# Patient Record
Sex: Female | Born: 1995 | Race: Black or African American | Hispanic: No | Marital: Single | Smoking: Never smoker
Health system: Southern US, Community
[De-identification: ages and names within clinical notes are randomized; demographics above are authoritative.]

## PROBLEM LIST (undated history)

## (undated) DIAGNOSIS — F329 Major depressive disorder, single episode, unspecified: Secondary | ICD-10-CM

## (undated) DIAGNOSIS — T7840XA Allergy, unspecified, initial encounter: Secondary | ICD-10-CM

## (undated) DIAGNOSIS — F419 Anxiety disorder, unspecified: Secondary | ICD-10-CM

## (undated) DIAGNOSIS — H539 Unspecified visual disturbance: Secondary | ICD-10-CM

## (undated) DIAGNOSIS — F32A Depression, unspecified: Secondary | ICD-10-CM

## (undated) DIAGNOSIS — Z789 Other specified health status: Secondary | ICD-10-CM

## (undated) DIAGNOSIS — F4310 Post-traumatic stress disorder, unspecified: Secondary | ICD-10-CM

## (undated) HISTORY — PX: NO PAST SURGERIES: SHX2092

## (undated) HISTORY — DX: Other specified health status: Z78.9

## (undated) HISTORY — DX: Unspecified visual disturbance: H53.9

## (undated) HISTORY — DX: Allergy, unspecified, initial encounter: T78.40XA

---
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
## 2017-04-07 ENCOUNTER — Ambulatory Visit: Payer: Medicaid Other | Admitting: Pediatrics

## 2017-04-29 ENCOUNTER — Ambulatory Visit: Payer: Medicaid Other | Admitting: Pediatrics

## 2017-04-29 ENCOUNTER — Ambulatory Visit (INDEPENDENT_AMBULATORY_CARE_PROVIDER_SITE_OTHER): Payer: Medicaid Other | Admitting: Pediatrics

## 2017-04-29 ENCOUNTER — Encounter: Payer: Self-pay | Admitting: Pediatrics

## 2017-04-29 VITALS — BP 120/70 | HR 83 | Ht 64.0 in | Wt 178.8 lb

## 2017-04-29 DIAGNOSIS — N946 Dysmenorrhea, unspecified: Secondary | ICD-10-CM

## 2017-04-29 DIAGNOSIS — Z00121 Encounter for routine child health examination with abnormal findings: Secondary | ICD-10-CM

## 2017-04-29 DIAGNOSIS — Z91010 Allergy to peanuts: Secondary | ICD-10-CM

## 2017-04-29 DIAGNOSIS — G4700 Insomnia, unspecified: Secondary | ICD-10-CM

## 2017-04-29 DIAGNOSIS — Z0001 Encounter for general adult medical examination with abnormal findings: Secondary | ICD-10-CM | POA: Diagnosis not present

## 2017-04-29 DIAGNOSIS — Z68.41 Body mass index (BMI) pediatric, 85th percentile to less than 95th percentile for age: Secondary | ICD-10-CM

## 2017-04-29 DIAGNOSIS — N920 Excessive and frequent menstruation with regular cycle: Secondary | ICD-10-CM

## 2017-04-29 DIAGNOSIS — Z113 Encounter for screening for infections with a predominantly sexual mode of transmission: Secondary | ICD-10-CM

## 2017-04-29 LAB — POCT RAPID HIV: Rapid HIV, POC: NEGATIVE

## 2017-04-29 MED ORDER — HYDROXYZINE PAMOATE 25 MG PO CAPS: 25.0000 mg | ORAL_CAPSULE | Freq: Every evening | ORAL | 1 refills | Status: DC | PRN

## 2017-04-29 MED ORDER — EPINEPHRINE 0.3 MG/0.3ML IJ SOAJ: 0.3000 mg | Freq: Once | INTRAMUSCULAR | 1 refills | Status: AC

## 2017-04-29 MED ORDER — IBUPROFEN 600 MG PO TABS: ORAL_TABLET | ORAL | 6 refills | Status: DC

## 2017-04-29 MED ORDER — NORETHINDRONE-ETH ESTRADIOL 1-35 MG-MCG PO TABS: 1.0000 | ORAL_TABLET | Freq: Every day | ORAL | 11 refills | Status: DC

## 2017-04-29 NOTE — Patient Instructions (Signed)
Use mild unscented soap for bathing.  Avoid douches or feminine products after period ends.  Take a probiotic or have yogurt every day

## 2017-04-29 NOTE — Progress Notes (Signed)
Adolescent Well Care Visit Tonya CoolerSierra Martin is a 21 y.o. female who is here for well care.  She is accompanied by her "sister" (daughter of her foster parents)    PCP:  Gregor Hamsebben, Izamar Linden, NP   History was provided by the patient.  Confidentiality was discussed with the patient and, if applicable, with caregiver as well. Patient's personal or confidential phone number: [phone]   Current Issues: Current concerns include  Treated for BV and Chlamydia 04/07/17.  Seems to get BV every time she has a period.  Needs refills of her Epi-pen (peanut allergic), Ibuprofen (for dysmenorrhea), Hydroxyzine (for anxiety and sleeplessness)  Nutrition: Nutrition/Eating Behaviors: various foods, mostly fast food option on campus Adequate calcium in diet?: loves milk Supplements/ Vitamins: no  Exercise/ Media: Play any Sports?/ Exercise: tries to work out several times a week  Screen Time:  > 2 hours-counseling provided Media Rules or Monitoring?: no  Sleep:  Sleep: 8 hours  Social Screening: Lives with:  Former foster family.  Will be in dorm when school starts Parental relations:  good Activities, Work, and Regulatory affairs officerChores?: working at [employer] this summer, will continue when school starts Concerns regarding behavior with peers?  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 Menstrual History: monthly, on now   Confidential Social History: Tobacco?  no Secondhand smoke exposure?  no Drugs/ETOH?  Yes, smokes weed occ  Sexually Active?  Yes, hasn't had sex since treated for chlamydia 3 weeks ago.  Says she informed her partner of his need to be tested but hasn't heard back from him Pregnancy Prevention: was on OCP's but recently only using condoms.  Not interested in implant or IUD  Safe at  home, in school & in relationships?  Yes Safe to self?  Yes   Screenings: Patient has a dental home: yes  The patient completed the Rapid Assessment for Adolescent Preventive Services screening questionnaire and the following topics were identified as risk factors and discussed: healthy eating, marijuana use, drug use, condom use and birth control  In addition, the following topics were discussed as part of anticipatory guidance exercise, mental health issues and screen time.  PHQ-9 completed and results indicated score of 6.  Has hx of insomnia and not getting enough sleep effects her mood  Physical Exam:  Vitals:   04/29/17 1406  BP: 120/70  Pulse: 83  Weight: 178 lb 12.8 oz (81.1 kg)  Height: 5\' 4"  (1.626 m)   BP 120/70 (BP Location: Right Arm, Patient Position: Sitting, Cuff Size: Normal)   Pulse 83   Ht 5\' 4"  (1.626 m)   Wt 178 lb 12.8 oz (81.1 kg)   BMI 30.69 kg/m  Body mass index: body mass index is 30.69 kg/m. Blood pressure percentiles are 79 % systolic and 70 % diastolic based on the August 2017 AAP Clinical Practice Guideline. Blood pressure percentile targets: 90: 126/78, 95: 129/81, 95 + 12 mmHg: 141/93. This reading is in the elevated blood pressure range (BP >= 120/80).   Hearing Screening   Method: Audiometry   125Hz  250Hz  500Hz  1000Hz  2000Hz  3000Hz  4000Hz  6000Hz  8000Hz   Right ear:   20 20 20  20     Left ear:   20 20 20  20       Visual Acuity Screening   Right eye Left eye Both  eyes  Without correction:     With correction: 20/20 20/20     General Appearance:   alert, oriented, no acute distress, obese teen  HENT: Normocephalic, no obvious abnormality, conjunctiva clear  Mouth:   Normal appearing teeth, no obvious discoloration, dental caries, or dental caps  Neck:   Supple; thyroid: no enlargement, symmetric, no tenderness/mass/nodules  Chest symm  Lungs:   Clear to auscultation bilaterally, normal work of breathing  Heart:   Regular rate and rhythm, S1  and S2 normal, no murmurs;   Abdomen:   Soft, non-tender, no mass, or organomegaly  GU genitalia not examined- on menses  Musculoskeletal:   Tone and strength strong and symmetrical, all extremities               Lymphatic:   No cervical adenopathy  Skin/Hair/Nails:   Skin warm, dry and intact, no rashes, no bruises or petechiae  Neurologic:   Strength, gait, and coordination normal and age-appropriate     Assessment and Plan:   Adolescent Wellness Exam Obesity  Dysmenorrhea  Insomnia Peanut allergy  BMI is not appropriate for age  Hearing screening result:normal Vision screening result: normal  Orders Placed This Encounter  Procedures  . GC/Chlamydia Probe Amp  . POCT Rapid HIV    Rx per orders for Epi-pen, Hydroxyzine, Ibuprofen and OCP's  Discussed use of mild soap, avoiding feminine products like douches and [place], take a daily Probiotic  Return in 1 year for next [facility], or sooner if needed   Gregor Hams, PPCNP-BC

## 2017-04-30 ENCOUNTER — Ambulatory Visit: Payer: Medicaid Other | Admitting: Pediatrics

## 2017-04-30 LAB — GC/CHLAMYDIA PROBE AMP
CT PROBE, AMP APTIMA: NOT DETECTED
GC Probe RNA: NOT DETECTED

## 2017-08-11 ENCOUNTER — Ambulatory Visit (INDEPENDENT_AMBULATORY_CARE_PROVIDER_SITE_OTHER): Payer: Medicaid Other | Admitting: Licensed Clinical Social Worker

## 2017-08-11 ENCOUNTER — Ambulatory Visit (INDEPENDENT_AMBULATORY_CARE_PROVIDER_SITE_OTHER): Payer: Medicaid Other | Admitting: Pediatrics

## 2017-08-11 ENCOUNTER — Encounter: Payer: Self-pay | Admitting: Pediatrics

## 2017-08-11 VITALS — Temp 98.0°F | Ht 64.75 in | Wt 178.6 lb

## 2017-08-11 DIAGNOSIS — Z7251 High risk heterosexual behavior: Secondary | ICD-10-CM

## 2017-08-11 DIAGNOSIS — F4323 Adjustment disorder with mixed anxiety and depressed mood: Secondary | ICD-10-CM

## 2017-08-11 DIAGNOSIS — B081 Molluscum contagiosum: Secondary | ICD-10-CM | POA: Diagnosis not present

## 2017-08-11 DIAGNOSIS — N898 Other specified noninflammatory disorders of vagina: Secondary | ICD-10-CM | POA: Diagnosis not present

## 2017-08-11 DIAGNOSIS — Z3200 Encounter for pregnancy test, result unknown: Secondary | ICD-10-CM

## 2017-08-11 DIAGNOSIS — Z8659 Personal history of other mental and behavioral disorders: Secondary | ICD-10-CM | POA: Insufficient documentation

## 2017-08-11 LAB — POCT URINE PREGNANCY: Preg Test, Ur: NEGATIVE

## 2017-08-11 MED ORDER — METRONIDAZOLE 500 MG PO TABS: 500.0000 mg | ORAL_TABLET | Freq: Two times a day (BID) | ORAL | 0 refills | Status: DC

## 2017-08-11 MED ORDER — NORETHINDRONE-ETH ESTRADIOL 1-35 MG-MCG PO TABS: 1.0000 | ORAL_TABLET | Freq: Every day | ORAL | 11 refills | Status: DC

## 2017-08-11 NOTE — Progress Notes (Signed)
Subjective:     Patient ID: Tonya Martin, female   DOB: 04/10/1996, 21 y.o.   MRN: [id number]  HPI:  21 year old female in by herself.  She has hx of depression and was on meds in the past.  Interested in resuming medication.  Sees a therapist (last name Sloan) at [facility].  She is currently a [employer] and also works at [employer].    She is in a new relationship since her visit in July.  She is only using condoms but does not want to get pregnant.  Doesn't feel like she can handle getting the Nexplanon right now but agreed to go back on her OCP's.  She currently has a funky-smelling, non-itchy vaginal discharge that she thinks is BV.  She has had this before.  Recently noticed tiny red bumps on her inner upper thighs near groin.  Areas are not red, itchy or draining pus. Not painful.   Review of Systems:non-contributory except as mentioned in HPI     Objective:   Physical Exam  Constitutional: She appears well-developed and well-nourished.  Genitourinary:  Genitourinary Comments: Exam deferred today  Skin:  Tiny, flesh-colored, wart-like papules on inner aspect of upper thighs just below groin area. Not inflamed or pustular  Nursing note and vitals reviewed.      Assessment:     Hx of depression Vaginal discharge- R/O STI High risk sexual behavior molloscum contagiosum     Plan:     Wet Prep- patient obtained Urine pregnancy test Urine for GC/Chlamydia  Rx per orders for OCP's and Metronidazole  [facility], Tim LairHannah Moore, spoke with patient.   Referral to [facility] for med management for antidepressants   Gregor HamsJacqueline Oakley Orban, PPCNP-BC

## 2017-08-11 NOTE — BH Specialist Note (Signed)
Integrated Behavioral Health Initial Visit  MRN: [id number] Name: Tonya Martin  Number of Integrated Behavioral Health Clinician visits:: 1/6 Session Start time: 10:30  Session End time: 11:25 Total time: 55 minutes  Type of Service: Integrated Behavioral Health- Individual/Family Interpretor:No. Interpretor Name and Language: n/a   Warm Hand Off Completed.       SUBJECTIVE: Tonya CoolerSierra Viramontes is a 21 y.o. female accompanied by self Patient was referred by J. Tebben, NP for pt desire to restart depression medications. Patient reports the following symptoms/concerns: Being interested in medication for depression, pt reports being connected to counseling through [facility], pt reports that she had been taking meds in the past, stopped taking it a few years ago, stopped taking it because she felt better, symptoms have returned, pt reports being tearful every day for a month, reports academic concerns, sleeping a lot, not eating a lot Duration of problem: several months; Severity of problem: severe  OBJECTIVE: Mood: Anxious, Depressed and Irritable and Affect: Appropriate and Depressed Risk of harm to self or others: No plan to harm self or others  LIFE CONTEXT: Family and Social: Pt reports foster family recently kicked her out of the house, pt is living on campus at school. Pt reports a few close and supportive friends at school, otherwise reports being isolated and not spending time with people a lot. 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 Pt reports often skipping classes, citing anxiety and worries about others judging her as part of her skipping classes. Pt also reports often not being able to get out of bed to get to class. Pt reports that work is sometimes stressful as well.  Self-Care: Listens to music, reports sleeping is helpful, reports sleeping for hours at a time. Pt reports being connected to a counselor through [facility]  counseling. Life Changes: Former foster family kicked her out in the last few months  GOALS ADDRESSED: Patient will: 1. Reduce symptoms of: anxiety and depression 2. Increase knowledge and/or ability of: coping skills and self-management skills  3. Demonstrate ability to: Increase healthy adjustment to current life circumstances and Increase adequate support systems for patient/family  INTERVENTIONS: Interventions utilized: Solution-Focused Strategies, Supportive Counseling and Link to [facility]  Standardized Assessments completed: PHQ-SADS  PHQ-SADS SCORE ONLY 08/11/2017  PHQ-15 11  GAD-7 18  PHQ-9 20  Suicidal Ideation No  Comment ADLs very difficult    ASSESSMENT: Patient currently experiencing elevated symptoms of anxiety and depression, as indicated both by pts verbal report as well as results from screening tool. Pt is connected to counseling in the community, reports that being a supportive and helpful relationship. Pt is also seeking out depression medication in addition to supportive counseling.   Patient may benefit from continuing to see her community counselor, appt scheduled for next week. Pt may also benefit form scheduling an appt with adolescent health in this clinic for medication management. Pt may also benefit from using mental health apps when at school and feeling anxious or depressed.  PLAN: 1. Follow up with behavioral health clinician on : None scheduled, pt connected w/ counseling, BH available as needed through PCP or adolescent pod. 2. Behavioral recommendations: Pt will continue to go to sessions with her counselor, pt will return to the clinic for appt with adolescent health, pt will download and utilize mental health apps 3. Referral(s): [facility] and adolescent medicine in this clinic. Appt scheduled for 08/20/17. Pt is already connected  to counseling 4. "From scale of 1-10, how likely are you to follow  plan?": pt expressed understanding and agreement.  Noralyn Pick, LPCA

## 2017-08-11 NOTE — Patient Instructions (Signed)

## 2017-08-12 LAB — WET PREP BY MOLECULAR PROBE
Candida species: NOT DETECTED
MICRO NUMBER: 81239665
SPECIMEN QUALITY:: ADEQUATE
TRICHOMONAS VAG: NOT DETECTED

## 2017-08-12 LAB — C. TRACHOMATIS/N. GONORRHOEAE RNA
C. TRACHOMATIS RNA, TMA: DETECTED — AB
N. GONORRHOEAE RNA, TMA: NOT DETECTED

## 2017-08-20 ENCOUNTER — Ambulatory Visit: Payer: Medicaid Other | Admitting: Pediatrics

## 2017-09-10 ENCOUNTER — Telehealth: Payer: Self-pay | Admitting: Pediatrics

## 2017-09-10 NOTE — Telephone Encounter (Signed)
Phone call to patient's cell- no answer.  Left message on home number to have her call me for review of lab results.  She was seen last month and treated for BV.  Her Chlamydia test came back positive and she needs treatment.   Gregor HamsJacqueline Gradie Martin, PPCNP-BC

## 2017-09-11 ENCOUNTER — Telehealth: Payer: Self-pay

## 2017-09-11 NOTE — Telephone Encounter (Signed)
-----   Message from Gregor HamsJacqueline Tebben, NP sent at 09/11/2017  9:00 AM EST ----- I saw Tonya Martin last month and treated her for BV.  Her Chlamydia came back positive and she needs treatment.  There was no answer when I called her cell yesterday and I left message on home phone for her to call us back about lab results.  Can you try her cell again today?  I'd like to have her treated before the weekend.  If she can't get in, I'm happy to call in Rx  Gregor HamsJacqueline Tebben, PPCNP-BC

## 2017-09-11 NOTE — Telephone Encounter (Signed)
Called cell phone and got message it was out of service. Left message on home phone to call [facility].

## 2017-09-12 NOTE — Telephone Encounter (Signed)
Brandy Semmons [facility] called to ask if this patient had been treated and that reportable disease form be [facility] We have been unable to contact patient by phone; letter generated in epic and mailed to home address on file. Reportable diseases form completed and [facility] [phone], confirmation received.

## 2017-09-17 ENCOUNTER — Telehealth: Payer: Self-pay

## 2017-09-17 ENCOUNTER — Other Ambulatory Visit: Payer: Self-pay | Admitting: Pediatrics

## 2017-09-17 DIAGNOSIS — A749 Chlamydial infection, unspecified: Secondary | ICD-10-CM

## 2017-09-17 MED ORDER — AZITHROMYCIN 500 MG PO TABS: ORAL_TABLET | ORAL | 0 refills | Status: DC

## 2017-09-17 NOTE — Telephone Encounter (Signed)
Spoke with patient and relayed message about positive Chlamydia. Encouraged office visit. However,  pt reports that she is unable to come to clinic anytime due to [place]. She requests treatment to be [facility] [place] on [address] Christella Hartiganouting to Dora SimsJackie Tebben, NP to review and advise.

## 2017-09-17 NOTE — Telephone Encounter (Signed)
Patient needs to come in for an appointment for treatment of chlamydia. Attempted to return patient call but went to VM. Left message to call [facility]

## 2017-09-17 NOTE — Progress Notes (Signed)
Teen with positive Chlamydia who has been hard to contact.  She finally returned our call and is about to move to [place].  She requests we send a Rx to her pharmacy for treatment.  Rx per orders for Azithromycin  Will send [facility] to [facility].   Gregor HamsJacqueline Royal Vandevoort, PPCNP-BC

## 2017-09-18 NOTE — Telephone Encounter (Signed)
Called patient and made her aware that treatment was sent to the pharmacy. Pt voiced understanding and plans to tell partner as well to ensure he is treated. No further action needed.

## 2018-01-04 ENCOUNTER — Ambulatory Visit (HOSPITAL_COMMUNITY)
Admission: EM | Admit: 2018-01-04 | Discharge: 2018-01-04 | Disposition: A | Discharge status: Home / Self Care | Payer: Medicaid Other | Attending: Internal Medicine | Admitting: Internal Medicine

## 2018-01-04 ENCOUNTER — Encounter (HOSPITAL_COMMUNITY): Payer: Self-pay | Admitting: Family Medicine

## 2018-01-04 DIAGNOSIS — R110 Nausea: Secondary | ICD-10-CM

## 2018-01-04 DIAGNOSIS — F1290 Cannabis use, unspecified, uncomplicated: Secondary | ICD-10-CM | POA: Insufficient documentation

## 2018-01-04 DIAGNOSIS — M791 Myalgia, unspecified site: Secondary | ICD-10-CM

## 2018-01-04 DIAGNOSIS — Z3202 Encounter for pregnancy test, result negative: Secondary | ICD-10-CM | POA: Diagnosis not present

## 2018-01-04 DIAGNOSIS — B349 Viral infection, unspecified: Secondary | ICD-10-CM

## 2018-01-04 DIAGNOSIS — F329 Major depressive disorder, single episode, unspecified: Secondary | ICD-10-CM | POA: Diagnosis not present

## 2018-01-04 DIAGNOSIS — R51 Headache: Secondary | ICD-10-CM

## 2018-01-04 DIAGNOSIS — F17200 Nicotine dependence, unspecified, uncomplicated: Secondary | ICD-10-CM | POA: Insufficient documentation

## 2018-01-04 DIAGNOSIS — R05 Cough: Secondary | ICD-10-CM | POA: Diagnosis not present

## 2018-01-04 DIAGNOSIS — F4323 Adjustment disorder with mixed anxiety and depressed mood: Secondary | ICD-10-CM | POA: Insufficient documentation

## 2018-01-04 LAB — POCT PREGNANCY, URINE: PREG TEST UR: NEGATIVE

## 2018-01-04 LAB — POCT URINALYSIS DIP (DEVICE)
Glucose, UA: NEGATIVE mg/dL
Hgb urine dipstick: NEGATIVE
Ketones, ur: 80 mg/dL — AB
Leukocytes, UA: NEGATIVE
NITRITE: NEGATIVE
PH: 6 (ref 5.0–8.0)
PROTEIN: NEGATIVE mg/dL
Specific Gravity, Urine: >=1.03 (ref 1.005–1.030)
UROBILINOGEN UA: 0.2 mg/dL (ref 0.0–1.0)

## 2018-01-04 MED ORDER — ONDANSETRON 4 MG PO TBDP: 4.0000 mg | ORAL_TABLET | Freq: Three times a day (TID) | ORAL | 0 refills | Status: DC | PRN

## 2018-01-04 MED ORDER — IPRATROPIUM BROMIDE 0.06 % NA SOLN: 2.0000 | Freq: Four times a day (QID) | NASAL | 0 refills | Status: DC

## 2018-01-04 MED ORDER — FLUTICASONE PROPIONATE 50 MCG/ACT NA SUSP: 2.0000 | Freq: Every day | NASAL | 0 refills | Status: DC

## 2018-01-04 MED ORDER — BENZONATATE 100 MG PO CAPS: 100.0000 mg | ORAL_CAPSULE | Freq: Three times a day (TID) | ORAL | 0 refills | Status: DC

## 2018-01-04 MED ORDER — MELOXICAM 7.5 MG PO TABS: 7.5000 mg | ORAL_TABLET | Freq: Every day | ORAL | 0 refills | Status: DC

## 2018-01-04 NOTE — Discharge Instructions (Signed)
Urine negative for urinary tract infection, pregnancy. Tessalon for cough.  Zofran as needed for nausea/vomiting.  Mobic for body aches, do not take ibuprofen or naproxen while on Mobic.  Start flonase, atrovent nasal spray for nasal congestion/drainage. You can use over the counter nasal saline rinse such as neti pot for nasal congestion. Keep hydrated, your urine should be clear to pale yellow in color.  Bland diet, advance as tolerated. Tylenol for fever and pain. Monitor for any worsening of symptoms, chest pain, shortness of breath, wheezing, swelling of the throat, follow up for reevaluation.   STD testing sent, you will be contacted with any positive results that requires further treatment. Refrain from sexual activity and alcohol use for the next 7 days. Monitor for any worsening of symptoms, fever, abdominal pain, nausea, vomiting, to follow up for reevaluation.

## 2018-01-04 NOTE — ED Triage Notes (Signed)
Pt here for cough and body aches since yesterday. sts also headache, diarrhea and frequent urination.

## 2018-01-04 NOTE — ED Notes (Signed)
Patient still unable to void.

## 2018-01-04 NOTE — ED Provider Notes (Signed)
[facility]    CSN: [id number] Arrival date & time: 01/04/18  1548     History   Chief Complaint Chief Complaint  Patient presents with  . Generalized Body Aches  . Cough    HPI Tonya Martin is a 22 y.o. female.   22 year old female comes in for 2-day history of URI symptoms.  She has had cough, body ache, nasal congestion, rhinorrhea, headache.  She has also had right-sided abdominal cramping that has since resolved.  Had nausea last night without vomiting, denies current nausea.  She has had 3 episodes of loose  stool without watery diarrhea.  Denies blood in stool.  Has also experienced urinary frequency, urgency without hematuria.  Denies fever, chills, night sweats. otc emergent-c without improvement. Current some day smoker.      Past Medical History:  Diagnosis Date  . Allergy   . Vision abnormalities     Patient Active Problem List   Diagnosis Date Noted  . History of depression 08/11/2017  . Vaginal discharge 08/11/2017  . High risk heterosexual behavior 08/11/2017  . Other seasonal allergic rhinitis 12/27/2015  . Adjustment disorder with mixed anxiety and depressed mood 04/13/2013  . Dysmenorrhea 04/13/2013  . Food allergy 04/13/2013    History reviewed. No pertinent surgical history.  OB History   None      Home Medications    Prior to Admission medications   Medication Sig Start Date End Date Taking? Authorizing Provider  benzonatate (TESSALON) 100 MG capsule Take 1 capsule (100 mg total) by mouth every 8 (eight) hours. 01/04/18   Cathie HoopsYu, Florita Nitsch V, PA-C  fluticasone (FLONASE) 50 MCG/ACT nasal spray Place 2 sprays into both nostrils daily. 01/04/18   Belinda FisherYu, Olean Sangster V, PA-C  ibuprofen (ADVIL,MOTRIN) 600 MG tablet Take one tablet at first sign of cramps then every 6 hours for pain 04/29/17   Gregor Hamsebben, Jacqueline, NP  ipratropium (ATROVENT) 0.06 % nasal spray Place 2 sprays into both nostrils 4 (four) times daily. 01/04/18   Cathie HoopsYu, Aishwarya Shiplett V, PA-C  meloxicam (MOBIC)  7.5 MG tablet Take 1 tablet (7.5 mg total) by mouth daily. 01/04/18   Cathie HoopsYu, Arien Morine V, PA-C  ondansetron (ZOFRAN ODT) 4 MG disintegrating tablet Take 1 tablet (4 mg total) by mouth every 8 (eight) hours as needed for nausea or vomiting. 01/04/18   Belinda FisherYu, Cera Rorke V, PA-C    Family History Family History  Problem Relation Age of Onset  . Cancer Mother   . Diabetes Mother   . Hyperlipidemia Mother   . Hypertension Mother   . Diabetes Maternal Grandmother   . Hypertension Maternal Grandmother     Social History Social History   Tobacco Use  . Smoking status: Current Some Day Smoker  . Smokeless tobacco: Never Used  . Tobacco comment: marijuana   Substance Use Topics  . Alcohol use: Not on file  . Drug use: Not on file     Allergies   Peanut-containing drug products   Review of Systems Review of Systems  Reason unable to perform ROS: See HPI as above.     Physical Exam Triage Vital Signs ED Triage Vitals [01/04/18 1653]  Enc Vitals Group     BP 139/86     Pulse Rate (!) 105     Resp 18     Temp 99.5 F (37.5 C)     Temp Source Oral     SpO2 100 %     Weight      Height  Head Circumference      Peak Flow      Pain Score 7     Pain Loc      Pain Edu?      Excl. in GC?    No data found.  Updated Vital Signs BP 139/86   Pulse (!) 105   Temp 99.5 F (37.5 C) (Oral)   Resp 18   LMP 12/04/2017   SpO2 100%   Physical Exam  Constitutional: She is oriented to person, place, and time. She appears well-developed and well-nourished. No distress.  HENT:  Head: Normocephalic and atraumatic.  Right Ear: Tympanic membrane, external ear and ear canal normal. Tympanic membrane is not erythematous and not bulging.  Left Ear: Tympanic membrane, external ear and ear canal normal. Tympanic membrane is not erythematous and not bulging.  Nose: Mucosal edema and rhinorrhea present. Right sinus exhibits no maxillary sinus tenderness and no frontal sinus tenderness. Left sinus exhibits  no maxillary sinus tenderness and no frontal sinus tenderness.  Mouth/Throat: Uvula is midline, oropharynx is clear and moist and mucous membranes are normal.  Eyes: Pupils are equal, round, and reactive to light. Conjunctivae are normal.  Neck: Normal range of motion. Neck supple.  Cardiovascular: Normal rate, regular rhythm and normal heart sounds. Exam reveals no gallop and no friction rub.  No murmur heard. Pulmonary/Chest: Effort normal and breath sounds normal. She has no decreased breath sounds. She has no wheezes. She has no rhonchi. She has no rales.  Abdominal: Soft. Bowel sounds are normal.  Generalized tenderness to palpation without guarding or rebound.  No rigidity.  No CVA tenderness.  Lymphadenopathy:    She has no cervical adenopathy.  Neurological: She is alert and oriented to person, place, and time.  Skin: Skin is warm and dry.  Psychiatric: She has a normal mood and affect. Her behavior is normal. Judgment normal.     UC Treatments / Results  Labs (all labs ordered are listed, but only abnormal results are displayed) Labs Reviewed  POCT URINALYSIS DIP (DEVICE) - Abnormal; Notable for the following components:      Result Value   Bilirubin Urine SMALL (*)    Ketones, ur 80 (*)    All other components within normal limits  POCT PREGNANCY, URINE  URINE CYTOLOGY ANCILLARY ONLY    EKG None Radiology No results found.  Procedures Procedures (including critical care time)  Medications Ordered in UC Medications - No data to display   Initial Impression / Assessment and Plan / UC Course  I have reviewed the triage vital signs and the nursing notes.  Pertinent labs & imaging results that were available during my care of the patient were reviewed by me and considered in my medical decision making (see chart for details).    Urine negative for UTI/pregnancy. Will treat for viral illness. Symptomatic treatment discussed. Patient would also like STD testing,  cytology sent. Push fluids. Return precautions given. Patient expresses understanding and agrees to plan.   Final Clinical Impressions(s) / UC Diagnoses   Final diagnoses:  Viral illness    ED Discharge Orders        Ordered    meloxicam (MOBIC) 7.5 MG tablet  Daily     01/04/18 1817    benzonatate (TESSALON) 100 MG capsule  Every 8 hours     01/04/18 1817    fluticasone (FLONASE) 50 MCG/ACT nasal spray  Daily     01/04/18 1817    ipratropium (ATROVENT) 0.06 % nasal  spray  4 times daily     01/04/18 1817    ondansetron (ZOFRAN ODT) 4 MG disintegrating tablet  Every 8 hours PRN     01/04/18 1817        Belinda Fisher, PA-C 01/04/18 1820

## 2018-01-05 LAB — URINE CYTOLOGY ANCILLARY ONLY
Chlamydia: NEGATIVE
NEISSERIA GONORRHEA: NEGATIVE
TRICH (WINDOWPATH): NEGATIVE

## 2018-01-07 LAB — URINE CYTOLOGY ANCILLARY ONLY: Candida vaginitis: NEGATIVE

## 2018-01-08 ENCOUNTER — Telehealth (HOSPITAL_COMMUNITY): Payer: Self-pay

## 2018-01-08 NOTE — Telephone Encounter (Signed)
Pt called regarding test results from recent visit. Report no vaginal discharge or irritation at this time.

## 2018-10-07 NOTE — L&D Delivery Note (Addendum)
Delivery Note  Tonya Martin is a 23 y.o. G1P0 s/p SVD at [redacted]w[redacted]d. She was admitted for SOL with pregnancy complicated by new onset gHTN.   ROM: 2h 37m with clear fluid GBS Status:  --Henderson Cloud (09/22 1156) Maximum Maternal Temperature: 98.7 F  Labor Progress: . Patient presented to L&D for  SOL  Initial SVE: 4 cm. She was briefly augmented with pitocin which was then stopped for decelerations, and subsequently she progressed spontaneously to fully dilated and had unintentional AROM with SVE.   Delivery Date/Time: 10/26/200 at 1336  Delivery: Called to room and patient was complete and pushing. Head delivered LOA. No nuchal cord present. Initially head did not delivery with gentle traction, but after lowering head of bed and repositioning maternal legs head delivered easily without further maneuvers. Shoulder and body delivered in usual fashion. Infant with spontaneous cry, placed on mother's abdomen, dried and stimulated. Cord clamped x 2 after 1-minute delay, and cut by father. Cord blood drawn. Placenta delivered spontaneously with gentle cord traction. Fundus firm with massage and Pitocin. Labia, perineum, vagina, and cervix inspected inspected with 1st degree laceration hemostatic with applied pressure and hemostatic L periuretheral. Neither laceration was repaired. Subsequently a post-placental Liletta IUD was placed with bimanual technique without complication, see separate procedure note.   Baby Weight: 3185 grams  Placenta: Sent to L&D Complications: None [id number]:4OE degree perineal and L periurtheral, both hemostatic and not repaired  EBL: 300 cc Analgesia: Epidural  Infant: APGAR (1 MIN): 8   APGAR (5 MINS): 9    Maxie Better, MD, PGY1  [facility] [place] [facility] 08/02/2019, 2:30 PM    OB FELLOW ATTESTATION  I was present, gloved, and supervising throughout the delivery. I have edited the above note to reflect any changes.   Augustin Coupe, MD/[facility] OB Fellow  08/02/2019, 2:38 PM

## 2018-10-09 ENCOUNTER — Encounter (HOSPITAL_COMMUNITY): Payer: Self-pay | Admitting: Emergency Medicine

## 2018-10-09 ENCOUNTER — Ambulatory Visit (HOSPITAL_COMMUNITY)
Admission: EM | Admit: 2018-10-09 | Discharge: 2018-10-09 | Disposition: A | Discharge status: Home / Self Care | Payer: Medicaid Other | Attending: Family Medicine | Admitting: Family Medicine

## 2018-10-09 DIAGNOSIS — F17200 Nicotine dependence, unspecified, uncomplicated: Secondary | ICD-10-CM | POA: Diagnosis not present

## 2018-10-09 DIAGNOSIS — Z791 Long term (current) use of non-steroidal anti-inflammatories (NSAID): Secondary | ICD-10-CM | POA: Insufficient documentation

## 2018-10-09 DIAGNOSIS — Z883 Allergy status to other anti-infective agents status: Secondary | ICD-10-CM | POA: Insufficient documentation

## 2018-10-09 DIAGNOSIS — Z792 Long term (current) use of antibiotics: Secondary | ICD-10-CM | POA: Diagnosis not present

## 2018-10-09 DIAGNOSIS — Z91018 Allergy to other foods: Secondary | ICD-10-CM | POA: Diagnosis not present

## 2018-10-09 DIAGNOSIS — Z833 Family history of diabetes mellitus: Secondary | ICD-10-CM | POA: Diagnosis not present

## 2018-10-09 DIAGNOSIS — N760 Acute vaginitis: Secondary | ICD-10-CM | POA: Diagnosis present

## 2018-10-09 DIAGNOSIS — Z91010 Allergy to peanuts: Secondary | ICD-10-CM | POA: Diagnosis not present

## 2018-10-09 DIAGNOSIS — Z79899 Other long term (current) drug therapy: Secondary | ICD-10-CM | POA: Diagnosis not present

## 2018-10-09 LAB — POCT URINALYSIS DIP (DEVICE)
GLUCOSE, UA: NEGATIVE mg/dL
Ketones, ur: 40 mg/dL — AB
NITRITE: NEGATIVE
PROTEIN: NEGATIVE mg/dL
UROBILINOGEN UA: 0.2 mg/dL (ref 0.0–1.0)
pH: 6 (ref 5.0–8.0)

## 2018-10-09 MED ORDER — METRONIDAZOLE 500 MG PO TABS: 500.0000 mg | ORAL_TABLET | Freq: Two times a day (BID) | ORAL | 0 refills | Status: DC

## 2018-10-09 MED ORDER — FLUCONAZOLE 150 MG PO TABS: 150.0000 mg | ORAL_TABLET | Freq: Every day | ORAL | 0 refills | Status: DC

## 2018-10-09 NOTE — ED Triage Notes (Signed)
Pt presents to [facility] for assessment of 1 month of vaginal discomfort, 1 week of "yeast" and one or two days of a swollen gland to her vagina.  States hx of yeast, BV and chlamydia.

## 2018-10-09 NOTE — Discharge Instructions (Addendum)
We will go ahead and treat you for a vaginal yeast and bacterial infection Medications sent to the pharmacy.  Swab sent for testing We will call with any positive results.  Urine was negative for pregnancy We will send the urine for culture.

## 2018-10-10 LAB — URINE CULTURE: CULTURE: NO GROWTH

## 2018-10-12 LAB — CERVICOVAGINAL ANCILLARY ONLY
CANDIDA VAGINITIS: POSITIVE — AB
CHLAMYDIA, DNA PROBE: NEGATIVE
NEISSERIA GONORRHEA: NEGATIVE
TRICH (WINDOWPATH): POSITIVE — AB

## 2018-10-13 ENCOUNTER — Telehealth (HOSPITAL_COMMUNITY): Payer: Self-pay | Admitting: Emergency Medicine

## 2018-10-13 NOTE — ED Provider Notes (Signed)
[facility]    CSN: [id number] Arrival date & time: 10/09/18  1427     History   Chief Complaint Chief Complaint  Patient presents with  . Vaginal Discharge    HPI Tonya Martin is a 23 y.o. female.   Patient is a 23 year old female that presents with 1 month of vaginal discomfort, discharge, irritation and swelling.  Symptoms have been constant and worsening.  She has not done anything for her symptoms.  She does have a past medical history of vaginal infections to include yeast, BV and chlamydia.  She is currently sexually active with one partner unprotected.Patient's last menstrual period was 09/16/2018.  She would like to be checked for STDs today.  She denies any abdominal pain, pelvic pain, back pain, dysuria, hematuria, urinary frequency.  She denies any fever, chills, body aches, night sweats, nausea, vomiting.   ROS per HPI       Past Medical History:  Diagnosis Date  . Allergy   . Vision abnormalities     Patient Active Problem List   Diagnosis Date Noted  . History of depression 08/11/2017  . Vaginal discharge 08/11/2017  . High risk heterosexual behavior 08/11/2017  . Other seasonal allergic rhinitis 12/27/2015  . Adjustment disorder with mixed anxiety and depressed mood 04/13/2013  . Dysmenorrhea 04/13/2013  . Food allergy 04/13/2013    History reviewed. No pertinent surgical history.  OB History   No obstetric history on file.      Home Medications    Prior to Admission medications   Medication Sig Start Date End Date Taking? Authorizing Provider  fluconazole (DIFLUCAN) 150 MG tablet Take 1 tablet (150 mg total) by mouth daily. 10/09/18   Janace ArisBast, [place]set Joshi A, NP  ibuprofen (ADVIL,MOTRIN) 600 MG tablet Take one tablet at first sign of cramps then every 6 hours for pain 04/29/17   Gregor Hamsebben, Jacqueline, NP  metroNIDAZOLE (FLAGYL) 500 MG tablet Take 1 tablet (500 mg total) by mouth 2 (two) times daily. 10/09/18   Janace ArisBast, Barney Russomanno A, NP    Family  History Family History  Problem Relation Age of Onset  . Cancer Mother   . Diabetes Mother   . Hyperlipidemia Mother   . Hypertension Mother   . Diabetes Maternal Grandmother   . Hypertension Maternal Grandmother     Social History Social History   Tobacco Use  . Smoking status: Current Some Day Smoker  . Smokeless tobacco: Never Used  . Tobacco comment: marijuana   Substance Use Topics  . Alcohol use: Not on file  . Drug use: Not on file     Allergies   Peanut-containing drug products and Flonase [fluticasone propionate]   Review of Systems Review of Systems   Physical Exam Triage Vital Signs ED Triage Vitals [10/09/18 1457]  Enc Vitals Group     BP 127/62     Pulse Rate 89     Resp 18     Temp 98.5 F (36.9 C)     Temp Source Oral     SpO2 100 %     Weight      Height      Head Circumference      Peak Flow      Pain Score 4     Pain Loc      Pain Edu?      Excl. in GC?    No data found.  Updated Vital Signs BP 127/62 (BP Location: Right Arm)   Pulse 89   Temp  98.5 F (36.9 C) (Oral)   Resp 18   LMP 09/16/2018   SpO2 100%   Visual Acuity Right Eye Distance:   Left Eye Distance:   Bilateral Distance:    Right Eye Near:   Left Eye Near:    Bilateral Near:     Physical Exam Vitals signs and nursing note reviewed.  Constitutional:      General: She is not in acute distress.    Appearance: Normal appearance. She is not ill-appearing, toxic-appearing or diaphoretic.  HENT:     Head: Normocephalic and atraumatic.     Nose: Nose normal.     Mouth/Throat:     Mouth: Mucous membranes are moist.     Pharynx: Oropharynx is clear.  Eyes:     Conjunctiva/sclera: Conjunctivae normal.  Neck:     Musculoskeletal: Normal range of motion.  Pulmonary:     Effort: Pulmonary effort is normal.  Abdominal:     Palpations: Abdomen is soft.     Tenderness: There is no abdominal tenderness. There is no right CVA tenderness, left CVA tenderness or  rebound.  Genitourinary:    Vagina: Vaginal discharge present.     Comments: Vulva  erythema and swelling.  Very tender to palpation of entire vaginal area.  Mild discharge noted without odor. No lesions or bleeding.  Internal exam deferred.  Musculoskeletal: Normal range of motion.  Skin:    General: Skin is warm and dry.  Neurological:     Mental Status: She is alert.  Psychiatric:        Mood and Affect: Mood normal.      UC Treatments / Results  Labs (all labs ordered are listed, but only abnormal results are displayed) Labs Reviewed  POCT URINALYSIS DIP (DEVICE) - Abnormal; Notable for the following components:      Result Value   Bilirubin Urine SMALL (*)    Ketones, ur 40 (*)    Hgb urine dipstick TRACE (*)    Leukocytes, UA SMALL (*)    All other components within normal limits  CERVICOVAGINAL ANCILLARY ONLY - Abnormal; Notable for the following components:   Candida vaginitis **POSITIVE for Candida species** (*)    Trichomonas **POSITIVE** (*)    All other components within normal limits  URINE CULTURE  POC URINE PREG, ED    EKG None  Radiology No results found.  Procedures Procedures (including critical care time)  Medications Ordered in UC Medications - No data to display  Initial Impression / Assessment and Plan / UC Course  I have reviewed the triage vital signs and the nursing notes.  Pertinent labs & imaging results that were available during my care of the patient were reviewed by me and considered in my medical decision making (see chart for details).     Patient is a 23 year old female that presents with vaginal discharge, itching, irritation and swelling for approximately 1 month.  We will go ahead and treat for bacterial vaginosis and yeast today pending swab results.  Instructed that we will call her with any positive results. Urine was negative for pregnancy or infection we will send urine for culture. Follow up as needed for continued or  worsening symptoms  Final Clinical Impressions(s) / UC Diagnoses   Final diagnoses:  Acute vaginitis     Discharge Instructions     We will go ahead and treat you for a vaginal yeast and bacterial infection Medications sent to the pharmacy.  Swab sent for testing We will call  with any positive results.  Urine was negative for pregnancy We will send the urine for culture.     ED Prescriptions    Medication Sig Dispense Auth. Provider   metroNIDAZOLE (FLAGYL) 500 MG tablet Take 1 tablet (500 mg total) by mouth 2 (two) times daily. 14 tablet Joshlynn Alfonzo A, NP   fluconazole (DIFLUCAN) 150 MG tablet Take 1 tablet (150 mg total) by mouth daily. 2 tablet Janace Aris, NP     Controlled Substance Prescriptions [place] Controlled Substance Registry consulted? no   Janace Aris, NP 10/13/18 [phone]

## 2018-10-13 NOTE — Telephone Encounter (Signed)
Trichomonas is positive. Rx metronidazole was given at the urgent care visit. Pt needs education to please refrain from sexual intercourse for 7 days to give the medicine time to work. Sexual partners need to be notified and tested/treated. Condoms may reduce risk of reinfection. Recheck for further evaluation if symptoms are not improving.   Candida (yeast) is positive.  Prescription for fluconazole was given at the urgent care visit.    Attempted to reach patient. No answer at this time. Call cannot be completed at this time.

## 2018-10-14 ENCOUNTER — Telehealth (HOSPITAL_COMMUNITY): Payer: Self-pay | Admitting: Emergency Medicine

## 2018-10-14 NOTE — Telephone Encounter (Signed)
Attempted to reach patient x2. No answer at this time. Call cannot be completed at this time.   

## 2018-10-18 ENCOUNTER — Telehealth (HOSPITAL_COMMUNITY): Payer: Self-pay | Admitting: Emergency Medicine

## 2018-10-18 NOTE — Telephone Encounter (Signed)
Attempted to reach patient x3. No answer at this time. Call cannot be completed. . Letter sent    

## 2018-11-12 ENCOUNTER — Telehealth (HOSPITAL_COMMUNITY): Payer: Self-pay | Admitting: Emergency Medicine

## 2018-11-12 NOTE — Telephone Encounter (Signed)
Returned patients voicemail, no answer, left voicemail.

## 2018-11-12 NOTE — Telephone Encounter (Signed)
Patient called back and given results, all questions answered.

## 2018-12-15 ENCOUNTER — Ambulatory Visit (INDEPENDENT_AMBULATORY_CARE_PROVIDER_SITE_OTHER): Payer: Medicaid Other | Admitting: Pediatrics

## 2018-12-15 ENCOUNTER — Encounter: Payer: Self-pay | Admitting: Pediatrics

## 2018-12-15 VITALS — BP 122/64 | HR 78 | Ht 65.75 in | Wt 168.2 lb

## 2018-12-15 DIAGNOSIS — Z8619 Personal history of other infectious and parasitic diseases: Secondary | ICD-10-CM | POA: Diagnosis not present

## 2018-12-15 DIAGNOSIS — Z3491 Encounter for supervision of normal pregnancy, unspecified, first trimester: Secondary | ICD-10-CM | POA: Diagnosis not present

## 2018-12-15 DIAGNOSIS — Z113 Encounter for screening for infections with a predominantly sexual mode of transmission: Secondary | ICD-10-CM

## 2018-12-15 DIAGNOSIS — Z3201 Encounter for pregnancy test, result positive: Secondary | ICD-10-CM | POA: Diagnosis not present

## 2018-12-15 DIAGNOSIS — F4323 Adjustment disorder with mixed anxiety and depressed mood: Secondary | ICD-10-CM | POA: Diagnosis not present

## 2018-12-15 LAB — POCT URINE PREGNANCY: Preg Test, Ur: POSITIVE — AB

## 2018-12-15 MED ORDER — PRENATAL VITAMIN 27-0.8 MG PO TABS: 1.0000 | ORAL_TABLET | Freq: Every day | ORAL | 11 refills | Status: AC

## 2018-12-15 NOTE — Patient Instructions (Addendum)
Make sure you are eating lots of small meals and snacks to help with nausea. If it becomes severe, you can take unisom at bedtime.  Start prenatal vitamin.  Resource lists below. Go ahead and call today for your first prenatal appointment. The health department has lots of pregnancy support groups so may be a good choice.  I will call you with lab results.   Employment / Personnel officer [employer] [place]: [phone] / [address]  [employer] ([employer]): [phone] ([place]) / [phone] ([place])  [employer], materials Center: [phone] / [phone]  [facility] Job & Career Center: [phone]  DHHS Work First: [phone] ([place]) / [phone] ([place])  [facility] [facility] [place]:  587-750-4290   Financial Assistance [facility]:  701 456 4284  [employer]: [phone]  [employer] Network (furniture):  5142288209  [facility] Helping Hands: 925-350-3143  Low Income Energy Assistance  618-831-5310   Food Assistance DHHS- SNAP/ Food Stamps: 240 059 2622  WIC: Manley Mason626-484-5088 ;  [place] 567-322-9928  L[place]ne B[employer] Book- Free Meals  Little Blue Book- Free Food Pantries  During the summer, text "FOOD" to 037096   General Health / Clinics (Adults) Orange Card (for Adults) through [facility]: [phone]  [facility]:   [phone]  [facility] Health & Wellness:   [phone]  Health Department:  [phone]  [facility]:  [phone] / [phone]  [facility] of [place]:   [phone])203-5925  [facility]:   [phone]   Housing [place] Housing Coalition:   825 211 3650  [facility] Housing Authority:  (501)442-6328  Affordable Housing Management:  878-278-5246  [facility] Ministry Pathways Shelter:  904-570-9939  [facility] / Center of [place]:  [phone] / [phone]   [facility] Family Shelter:  (929)227-2391    Transportation Medicaid Transportation: 647-715-6986 to apply  [place] Authority: [phone] (reduced-fare bus ID to Medicaid/ Medicare/ Orange Card  SCAT Paratransit services: Eligible riders only, call [phone] for application   Childcare [place] Child Development: [phone] ([place]) / [phone] ([place])  - Child Care Resources/ Referrals/ Scholarships  - Head Start/ Early Head Start (call or apply online)  [place] DHHS: [place] [employer] :  [phone] / [phone]   Baby & Breastfeeding Car Seat Inspection @ Various [place] Equities trader.- call [phone]  [facility] Health Lactation  [phone]  [facility] Regional Lactation [phone]  WIC: [phone] ([place]);  [phone] ([place])  [place] [employer]:  [phone]   Childcare [place] Child Development: [phone] [facility]) / [phone] ([place])  - Child Care Resources/ Referrals/ Scholarships  - Head Start/ Early Head Start (call or apply online)  [place] DHHS: [place] Pre-K :  4780257505 / [phone]   Parenting Children's Home Society:  [phone]  [facility]: Education Center & Support Groups:  ([phone]  [employer]: [phone]  [employer]: Bringing Out the Best:  [phone]               Thriving at Three (Hispanic families): [phone]  Healthy Start (Family Service of the [place]):  [phone]  Parents as Teachers:  [phone]  [place] Child Development- Learning Together (Immigrants): 765-800-4778   Special Needs Family Support Network:  918-744-3301  Autism Society of [place]:   (726) [phone] or [phone] /  [phone]  [facility]:  [phone]  ARC of [place]:  [phone]  Children's Developmental Service Agency (CDSA):  801-752-0281  [facility] (Care Coordination for Children):  [phone]   Postpartum Support [facility]- Feelings After Birth: Tuesday 10am-11am; [phone]  [facility]- Baby & Me: Thursdays 11am-12pm;  [phone]  [facility]- Healthy Moms, Healthy Babies &  Teen Parent Mentor Program: [phone]  Postpartum Support International(PSI) Warmline:  825-852-4467  Heartstrings: support for grieving parents:  ([phone]    [facility]  Two [place] and one [place] location  [phone] Same day and next day appointments available  Tuesday evening latest appointment 5:30 pm  Free and reduced cost services  Contraceptive, OB and GYN needs available  STI testing available   Center for [facility]  [address] [place],  [place]  [address] [phone] M-Th 8 am - 5 pm  Friday 8 am - 12 pm  Accepts Medicaid and private insurance  Contraceptive, OB and GYN needs available  STI testing available   [facility]  [address] [place], [place], [place] [address] [phone] Contraceptive, OB and GYN needs available  Marcy Siren, DO Accepts medicaid    First Trimester of Pregnancy The first trimester of pregnancy is from week 1 until the end of week 13 (months 1 through 3). A week after a sperm fertilizes an egg, the egg will implant on the wall of the uterus. This embryo will begin to develop into a baby. Genes from you and your partner will form the baby. The female genes will determine whether the baby will be a boy or a girl. At 6-8 weeks, the eyes and face will be formed, and the heartbeat can be seen on ultrasound. At the end of 12 weeks, all the baby's organs will be formed. Now that you are pregnant, you will want to do everything you can to have a healthy baby. Two of the most important things are to get good prenatal care and to follow your health care provider's instructions. Prenatal care is all the medical care you receive before the baby's birth. This care will help prevent, find, and treat any problems during the pregnancy and childbirth. Body changes during your first trimester Your body goes through many changes during pregnancy. The changes vary from woman to  woman.  You may gain or lose a couple of pounds at first.  You may feel sick to your stomach (nauseous) and you may throw up (vomit). If the vomiting is uncontrollable, call your health care provider.  You may tire easily.  You may develop headaches that can be relieved by medicines. All medicines should be approved by your health care provider.  You may urinate more often. Painful urination may mean you have a bladder infection.  You may develop heartburn as a result of your pregnancy.  You may develop constipation because certain hormones are causing the muscles that push stool through your intestines to slow down.  You may develop hemorrhoids or swollen veins (varicose veins).  Your breasts may begin to grow larger and become tender. Your nipples may stick out more, and the tissue that surrounds them (areola) may become darker.  Your gums may bleed and may be sensitive to brushing and flossing.  Dark spots or blotches (chloasma, mask of pregnancy) may develop on your face. This will likely fade after the baby is born.  Your menstrual periods will stop.  You may have a loss of appetite.  You may develop cravings for certain kinds of food.  You may have changes in your emotions from day to day, such as being excited to be pregnant or being concerned that something may go wrong with the pregnancy and baby.  You may have more vivid and strange dreams.  You may have changes in your hair. These can include thickening of your hair, rapid growth, and changes in texture. Some women also have hair loss during or after pregnancy, or hair that feels dry or thin. Your hair will most likely return to normal after your baby is born. What to expect at prenatal visits During a routine prenatal visit:  You will be weighed to make sure you and the baby are growing normally.  Your blood pressure will be taken.  Your abdomen will be measured to track your baby's growth.  The fetal heartbeat  will be listened to between weeks 10 and 14 of your pregnancy.  Test results from any previous visits will be discussed. Your health care provider may ask you:  How you are feeling.  If you are feeling the baby move.  If you have had any abnormal symptoms, such as leaking fluid, bleeding, severe headaches, or abdominal cramping.  If you are using any tobacco products, including cigarettes, chewing tobacco, and electronic cigarettes.  If you have any questions. Other tests that may be performed during your first trimester include:  Blood tests to find your blood type and to check for the presence of any previous infections. The tests will also be used to check for low iron levels (anemia) and protein on red blood cells (Rh antibodies). Depending on your risk factors, or if you previously had diabetes during pregnancy, you may have tests to check for high blood sugar that affects pregnant women (gestational diabetes).  Urine tests to check for infections, diabetes, or protein in the urine.  An ultrasound to confirm the proper growth and development of the baby.  Fetal screens for spinal cord problems (spina bifida) and Down syndrome.  HIV (human immunodeficiency virus) testing. Routine prenatal testing includes screening for HIV, unless you choose not to have this test.  You may need other tests to make sure you and the baby are doing well. Follow these instructions at home: Medicines  Follow your health care provider's instructions regarding medicine use. Specific medicines may be either safe or unsafe to take during pregnancy.  Take a prenatal vitamin that contains at least 600 micrograms (mcg) of folic acid.  If you develop constipation, try taking a stool softener if your health care provider approves. Eating and drinking   Eat a balanced diet that includes fresh fruits and vegetables, whole grains, good sources of protein such as meat, eggs, or tofu, and low-fat dairy. Your  health care provider will help you determine the amount of weight gain that is right for you.  Avoid raw meat and uncooked cheese. These carry germs that can cause birth defects in the baby.  Eating four or five small meals rather than three large meals a day may help relieve nausea and vomiting. If you start to feel nauseous, eating a few soda crackers can be helpful. Drinking liquids between meals, instead of during meals, also seems to help ease nausea and vomiting.  Limit foods that are high in fat and processed sugars, such as fried and sweet foods.  To prevent constipation: ? Eat foods that are high in fiber, such as fresh fruits and vegetables, whole grains, and beans. ? Drink enough fluid to keep your urine clear or pale yellow. Activity  Exercise only as directed by your health care provider. Most women can continue their usual exercise routine during pregnancy. Try to exercise for 30 minutes at least 5  days a week. Exercising will help you: ? Control your weight. ? Stay in shape. ? Be prepared for labor and delivery.  Experiencing pain or cramping in the lower abdomen or lower back is a good sign that you should stop exercising. Check with your health care provider before continuing with normal exercises.  Try to avoid standing for long periods of time. Move your legs often if you must stand in one place for a long time.  Avoid heavy lifting.  Wear low-heeled shoes and practice good posture.  You may continue to have sex unless your health care provider tells you not to. Relieving pain and discomfort  Wear a good support bra to relieve breast tenderness.  Take warm sitz baths to soothe any pain or discomfort caused by hemorrhoids. Use hemorrhoid cream if your health care provider approves.  Rest with your legs elevated if you have leg cramps or low back pain.  If you develop varicose veins in your legs, wear support hose. Elevate your feet for 15 minutes, 3-4 times a day.  Limit salt in your diet. Prenatal care  Schedule your prenatal visits by the twelfth week of pregnancy. They are usually scheduled monthly at first, then more often in the last 2 months before delivery.  Write down your questions. Take them to your prenatal visits.  Keep all your prenatal visits as told by your health care provider. This is important. Safety  Wear your seat belt at all times when driving.  Make a list of emergency phone numbers, including numbers for family, friends, the hospital, and police and fire departments. General instructions  Ask your health care provider for a referral to a local prenatal education class. Begin classes no later than the beginning of month 6 of your pregnancy.  Ask for help if you have counseling or nutritional needs during pregnancy. Your health care provider can offer advice or refer you to specialists for help with various needs.  Do not use hot tubs, steam rooms, or saunas.  Do not douche or use tampons or scented sanitary pads.  Do not cross your legs for long periods of time.  Avoid cat litter boxes and soil used by cats. These carry germs that can cause birth defects in the baby and possibly loss of the fetus by miscarriage or stillbirth.  Avoid all smoking, herbs, alcohol, and medicines not prescribed by your health care provider. Chemicals in these products affect the formation and growth of the baby.  Do not use any products that contain nicotine or tobacco, such as cigarettes and e-cigarettes. If you need help quitting, ask your health care provider. You may receive counseling support and other resources to help you quit.  Schedule a dentist appointment. At home, brush your teeth with a soft toothbrush and be gentle when you floss. Contact a health care provider if:  You have dizziness.  You have mild pelvic cramps, pelvic pressure, or nagging pain in the abdominal area.  You have persistent nausea, vomiting, or  diarrhea.  You have a bad smelling vaginal discharge.  You have pain when you urinate.  You notice increased swelling in your face, hands, legs, or ankles.  You are exposed to fifth disease or chickenpox.  You are exposed to [place] measles (rubella) and have never had it. Get help right away if:  You have a fever.  You are leaking fluid from your vagina.  You have spotting or bleeding from your vagina.  You have severe abdominal cramping or  pain.  You have rapid weight gain or loss.  You vomit blood or material that looks like coffee grounds.  You develop a severe headache.  You have shortness of breath.  You have any kind of trauma, such as from a fall or a car accident. Summary  The first trimester of pregnancy is from week 1 until the end of week 13 (months 1 through 3).  Your body goes through many changes during pregnancy. The changes vary from woman to woman.  You will have routine prenatal visits. During those visits, your health care provider will examine you, discuss any test results you may have, and talk with you about how you are feeling. This information is not intended to replace advice given to you by your health care provider. Make sure you discuss any questions you have with your health care provider. Document Released: 09/17/2001 Document Revised: 09/04/2016 Document Reviewed: 09/04/2016 Elsevier Interactive Patient Education  2019 [employer].

## 2018-12-15 NOTE — Progress Notes (Signed)
History was provided by the patient.  Tonya Martin is a 23 y.o. female who is here for positive pregnancy test.  Gregor Hams, NP   HPI:  Pt reports that she was living in [place] but got kicked out for something she didn't do. She came back about 1 year ago.   Took two home pregnancy tests and they were positive. Last period Jan 19th. She is still fairly in shock.   She has a great partner who she has been with for some time. They have many plans together, are still struggling financially. She is grateful for resources we have today.   She is not having any vaginal bleeding. She has some pelvic fullness. Denies cramping, vaginal itching or discharge. Denies pain with sex. Was treated for trich. Has had vomiting twice. Is having some loss of appetite which isn't new- also has some food insecurity.   Patient's last menstrual period was 10/25/2018.  Review of Systems  Constitutional: Negative for malaise/fatigue.  Eyes: Negative for double vision.  Respiratory: Negative for shortness of breath.   Cardiovascular: Negative for chest pain and palpitations.  Gastrointestinal: Positive for nausea and vomiting. Negative for abdominal pain, constipation and diarrhea.  Genitourinary: Negative for dysuria.  Musculoskeletal: Negative for joint pain and myalgias.  Skin: Negative for rash.  Neurological: Negative for dizziness and headaches.  Endo/Heme/Allergies: Does not bruise/bleed easily.    Patient Active Problem List   Diagnosis Date Noted  . History of depression 08/11/2017  . Vaginal discharge 08/11/2017  . High risk heterosexual behavior 08/11/2017  . Other seasonal allergic rhinitis 12/27/2015  . Adjustment disorder with mixed anxiety and depressed mood 04/13/2013  . Dysmenorrhea 04/13/2013  . Food allergy 04/13/2013    No current outpatient medications on file prior to visit.   No current facility-administered medications on file prior to visit.     Allergies   Allergen Reactions  . Peanut-Containing Drug Products Hives and Shortness Of Breath    Same reaction with chick peas.  Aleda Grana [Fluticasone Propionate] Other (See Comments)    Nasal burning    Social History: Confidentiality was discussed with the patient and if applicable, with caregiver as well. Tobacco: no Secondhand smoke exposure? no Drugs/EtOH: none Sexually active? yes - one female partner  Safety: safe to self and at home Last STI Screening:today Pregnancy Prevention: none  Physical Exam:    Vitals:   12/15/18 0909  BP: 122/64  Pulse: 78  Weight: 168 lb 3.2 oz (76.3 kg)  Height: 5' 5.75" (1.67 m)    Growth percentile SmartLinks can only be used for patients less than 51 years old.  Physical Exam Vitals signs and nursing note reviewed.  Constitutional:      General: She is not in acute distress.    Appearance: She is well-developed.  Neck:     Thyroid: No thyromegaly.  Cardiovascular:     Rate and Rhythm: Normal rate and regular rhythm.     Heart sounds: No murmur.  Pulmonary:     Breath sounds: Normal breath sounds.  Abdominal:     Palpations: Abdomen is soft. There is no mass.     Tenderness: There is no abdominal tenderness. There is no guarding.  Musculoskeletal:     Right lower leg: No edema.     Left lower leg: No edema.  Lymphadenopathy:     Cervical: No cervical adenopathy.  Skin:    General: Skin is warm.     Findings: No rash.  Neurological:  Mental Status: She is alert.     Comments: No tremor     Assessment/Plan: 1. Adjustment disorder with mixed anxiety and depressed mood This is a lot to absorb for her. Gave her a number of resources and let her know that we are here for her with questions, concerns and needs. She was appreciative. Her cousin works at the [employer] and is a doula as well. She has good family and partner support.   2. First trimester pregnancy Start PNV now. Discussed reasons to call here vs. Go to the ER.  Discussed new WCC on [place]. Will get Bhcg and STI testing today.  - Prenatal Vit-Fe Fumarate-FA (PRENATAL VITAMIN) 27-0.8 MG TABS; Take 1 tablet by mouth daily.  Dispense: 30 tablet; Refill: 11  3. History of trichomoniasis Repeated today. Was positive in ED and treated in Feb.   4. Pregnancy examination or test, positive result Looks based on LMP to be about 7 weeks today.  - POCT urine pregnancy - Beta HCG, Quant  5. Routine screening for STI (sexually transmitted infection) Per protocol.  - C. trachomatis/N. gonorrhoeae RNA - Trichomonas vaginalis, RNA - HIV antibody (with reflex) - RPR

## 2018-12-16 ENCOUNTER — Other Ambulatory Visit: Payer: Self-pay | Admitting: Pediatrics

## 2018-12-16 DIAGNOSIS — Z3201 Encounter for pregnancy test, result positive: Secondary | ICD-10-CM

## 2018-12-16 DIAGNOSIS — Z113 Encounter for screening for infections with a predominantly sexual mode of transmission: Secondary | ICD-10-CM

## 2018-12-16 LAB — TRICHOMONAS VAGINALIS, PROBE AMP: TRICHOMONAS VAGINALIS RNA: NOT DETECTED

## 2018-12-16 LAB — C. TRACHOMATIS/N. GONORRHOEAE RNA
C. trachomatis RNA, TMA: NOT DETECTED
N. gonorrhoeae RNA, TMA: NOT DETECTED

## 2018-12-17 ENCOUNTER — Ambulatory Visit (INDEPENDENT_AMBULATORY_CARE_PROVIDER_SITE_OTHER): Payer: Medicaid Other | Admitting: *Deleted

## 2018-12-17 ENCOUNTER — Other Ambulatory Visit: Payer: Self-pay

## 2018-12-17 DIAGNOSIS — Z3201 Encounter for pregnancy test, result positive: Secondary | ICD-10-CM | POA: Diagnosis not present

## 2018-12-17 DIAGNOSIS — Z113 Encounter for screening for infections with a predominantly sexual mode of transmission: Secondary | ICD-10-CM

## 2018-12-18 LAB — HCG, QUANTITATIVE, PREGNANCY: HCG, Total, QN: 52405 m[IU]/mL

## 2018-12-18 LAB — RPR: RPR Ser Ql: NONREACTIVE

## 2018-12-18 LAB — HIV ANTIBODY (ROUTINE TESTING W REFLEX): HIV 1&2 Ab, 4th Generation: NONREACTIVE

## 2018-12-29 NOTE — Progress Notes (Signed)
Patient came in for labs B-HCG Quant, HIV antibody w. Ref and RPR. Labs ordered by Alfonso Ramus. Successful collection.

## 2019-01-18 ENCOUNTER — Encounter: Payer: Medicaid Other | Admitting: Advanced Practice Midwife

## 2019-01-19 ENCOUNTER — Encounter: Payer: Self-pay | Admitting: Advanced Practice Midwife

## 2019-01-19 ENCOUNTER — Ambulatory Visit (INDEPENDENT_AMBULATORY_CARE_PROVIDER_SITE_OTHER): Payer: Medicaid Other | Admitting: Advanced Practice Midwife

## 2019-01-19 ENCOUNTER — Other Ambulatory Visit: Payer: Self-pay

## 2019-01-19 VITALS — BP 125/79 | HR 73 | Wt 167.0 lb

## 2019-01-19 DIAGNOSIS — Z3A19 19 weeks gestation of pregnancy: Secondary | ICD-10-CM

## 2019-01-19 DIAGNOSIS — Z3490 Encounter for supervision of normal pregnancy, unspecified, unspecified trimester: Secondary | ICD-10-CM

## 2019-01-19 DIAGNOSIS — Z3481 Encounter for supervision of other normal pregnancy, first trimester: Secondary | ICD-10-CM | POA: Diagnosis not present

## 2019-01-19 DIAGNOSIS — Z3A12 12 weeks gestation of pregnancy: Secondary | ICD-10-CM

## 2019-01-19 DIAGNOSIS — Z3491 Encounter for supervision of normal pregnancy, unspecified, first trimester: Secondary | ICD-10-CM

## 2019-01-19 NOTE — Patient Instructions (Signed)

## 2019-01-19 NOTE — Progress Notes (Signed)
Planned: No Last pap: N/A due to age  Genetic Screening: Declines

## 2019-01-19 NOTE — Progress Notes (Signed)
Subjective:   Neziah Scheibel is a 23 y.o. G1P0 at [redacted]w[redacted]d by LMP being seen today for her first obstetrical visit.  Her obstetrical history is significant for none, G1 and has Adjustment disorder with mixed anxiety and depressed mood; Dysmenorrhea; Food allergy; Other seasonal allergic rhinitis; Vaginal discharge; and Supervision of normal pregnancy, antepartum on their problem list.. Patient does intend to breast feed. Pregnancy history fully reviewed.  Patient reports no complaints.  HISTORY: OB History  Gravida Para Term Preterm AB Living  1 0 0 0 0 0  SAB TAB Ectopic Multiple Live Births  0 0 0 0 0    # Outcome Date GA Lbr Len/2nd Weight Sex Delivery Anes PTL Lv  1 Current            Past Medical History:  Diagnosis Date  . Allergy   . Vision abnormalities    History reviewed. No pertinent surgical history. Family History  Problem Relation Age of Onset  . Cancer Mother   . Diabetes Mother   . Hyperlipidemia Mother   . Hypertension Mother   . Diabetes Maternal Grandmother   . Hypertension Maternal Grandmother    Social History   Tobacco Use  . Smoking status: Never Smoker  . Smokeless tobacco: Never Used  . Tobacco comment: marijuana   Substance Use Topics  . Alcohol use: Never    Alcohol/week: 0.0 standard drinks    Frequency: Never  . Drug use: Not Currently   Allergies  Allergen Reactions  . Peanut-Containing Drug Products Hives and Shortness Of Breath    Same reaction with chick peas.  Aleda Grana [Fluticasone Propionate] Other (See Comments)    Nasal burning   Current Outpatient Medications on File Prior to Visit  Medication Sig Dispense Refill  . Prenatal Vit-Fe Fumarate-FA (PRENATAL VITAMIN) 27-0.8 MG TABS Take 1 tablet by mouth daily. 30 tablet 11   No current facility-administered medications on file prior to visit.      Exam   Vitals:   01/19/19 1139  BP: 125/79  Pulse: 73  Weight: 75.8 kg   Fetal Heart Rate (bpm): 162  Uterus:      Pelvic Exam: Perineum: no hemorrhoids, normal perineum   Vulva: normal external genitalia, no lesions   Vagina:  normal mucosa, normal discharge   Cervix: no lesions and normal, pap smear done.    Adnexa: normal adnexa and no mass, fullness, tenderness   Bony Pelvis: average  System: General: well-developed, well-nourished female in no acute distress   Breast:  normal appearance, no masses or tenderness   Skin: normal coloration and turgor, no rashes   Neurologic: oriented, normal, negative, normal mood   Extremities: normal strength, tone, and muscle mass, ROM of all joints is normal   HEENT PERRLA, extraocular movement intact and sclera clear, anicteric   Mouth/Teeth mucous membranes moist, pharynx normal without lesions and dental hygiene good   Neck supple and no masses   Cardiovascular: regular rate and rhythm   Respiratory:  no respiratory distress, normal breath sounds   Abdomen: soft, non-tender; bowel sounds normal; no masses,  no organomegaly     Assessment:   Pregnancy: G1P0 Patient Active Problem List   Diagnosis Date Noted  . Supervision of normal pregnancy, antepartum 01/19/2019  . Vaginal discharge 08/11/2017  . Other seasonal allergic rhinitis 12/27/2015  . Adjustment disorder with mixed anxiety and depressed mood 04/13/2013  . Dysmenorrhea 04/13/2013  . Food allergy 04/13/2013     Plan:  1. Encounter for supervision of normal pregnancy, antepartum, unspecified gravidity --Anticipatory guidance about next visits/weeks of pregnancy given. --Reviewed safety, visitor policy, reassurance about COVID-19 for pregnancy at this time. Discussed possible changes to visits, including televisits, that may occur due to COVID-19.  The office remains open if pt needs to be seen and MAU is open 24 hours/day for OB emergencies.    - Obstetric Panel, Including HIV - Culture, OB Urine - Hemoglobinopathy evaluation - Cervicovaginal ancillary only( [place])    Initial  labs drawn. Continue prenatal vitamins. Discussed and offered genetic screening options, including Quad screen/AFP, NIPS testing, and option to decline testing. Benefits/risks/alternatives reviewed. Pt aware that anatomy US is form of genetic screening with lower accuracy in detecting trisomies than blood work.  Pt chooses/declines genetic screening today. First trimester screen, Quad screen and NIPS: declined. Ultrasound discussed; fetal anatomic survey: requested. Problem list reviewed and updated. The nature [facility] with multiple MDs and other Advanced Practice Providers was explained to patient; also emphasized that residents, students are part of our team. Routine obstetric precautions reviewed. No follow-ups on file.   Sharen CounterLisa Leftwich-Kirby, CNM 01/19/19 12:00 PM

## 2019-01-21 LAB — OBSTETRIC PANEL, INCLUDING HIV
Antibody Screen: NEGATIVE
Basophils Absolute: 0 10*3/uL (ref 0.0–0.2)
Basos: 1 %
EOS (ABSOLUTE): 0.4 10*3/uL (ref 0.0–0.4)
Eos: 5 %
HIV Screen 4th Generation wRfx: NONREACTIVE
Hematocrit: 37 % (ref 34.0–46.6)
Hemoglobin: 12.5 g/dL (ref 11.1–15.9)
Hepatitis B Surface Ag: NEGATIVE
Immature Grans (Abs): 0 10*3/uL (ref 0.0–0.1)
Immature Granulocytes: 0 %
Lymphocytes Absolute: 1.3 10*3/uL (ref 0.7–3.1)
Lymphs: 16 %
MCH: 28.9 pg (ref 26.6–33.0)
MCHC: 33.8 g/dL (ref 31.5–35.7)
MCV: 86 fL (ref 79–97)
Monocytes Absolute: 1 10*3/uL — ABNORMAL HIGH (ref 0.1–0.9)
Monocytes: 12 %
Neutrophils Absolute: 5.3 10*3/uL (ref 1.4–7.0)
Neutrophils: 66 %
Platelets: 291 10*3/uL (ref 150–450)
RBC: 4.32 x10E6/uL (ref 3.77–5.28)
RDW: 13.1 % (ref 11.7–15.4)
RPR Ser Ql: NONREACTIVE
Rh Factor: POSITIVE
Rubella Antibodies, IGG: 3.36 index (ref >0.99)
WBC: 8.1 10*3/uL (ref 3.4–10.8)

## 2019-01-21 LAB — HEMOGLOBINOPATHY EVALUATION
HGB C: 0 %
HGB S: 0 %
HGB VARIANT: 0 %
Hemoglobin A2 Quantitation: 2.4 % (ref 1.8–3.2)
Hemoglobin F Quantitation: 0 % (ref 0.0–2.0)
Hgb A: 97.6 % (ref 96.4–98.8)

## 2019-01-21 LAB — CULTURE, OB URINE

## 2019-01-21 LAB — URINE CULTURE, OB REFLEX

## 2019-02-08 ENCOUNTER — Telehealth: Payer: Self-pay

## 2019-02-08 NOTE — Telephone Encounter (Signed)
Called patient to verify access to BP cuff and enroll in babyRx if needed. Left message.  Rolm Bookbinder, CNM 02/08/19 9:39 AM

## 2019-02-11 ENCOUNTER — Telehealth: Payer: Self-pay | Admitting: *Deleted

## 2019-02-11 NOTE — Telephone Encounter (Signed)
Pt called to office stating she does have access to a BP cuff at home.  Attempt to contact pt to discuss BabyRx. No answer, LM on VM to call to discuss or may discuss at appt on Tuesday.

## 2019-02-16 ENCOUNTER — Ambulatory Visit (INDEPENDENT_AMBULATORY_CARE_PROVIDER_SITE_OTHER): Payer: Medicaid Other | Admitting: Obstetrics

## 2019-02-16 ENCOUNTER — Other Ambulatory Visit: Payer: Self-pay

## 2019-02-16 DIAGNOSIS — Z3490 Encounter for supervision of normal pregnancy, unspecified, unspecified trimester: Secondary | ICD-10-CM

## 2019-02-16 DIAGNOSIS — Z3492 Encounter for supervision of normal pregnancy, unspecified, second trimester: Secondary | ICD-10-CM

## 2019-02-16 DIAGNOSIS — Z3A16 16 weeks gestation of pregnancy: Secondary | ICD-10-CM

## 2019-02-16 MED ORDER — BLOOD PRESSURE KIT: 1.0000 | PACK | Freq: Every day | 0 refills | Status: DC

## 2019-02-16 MED ORDER — BLOOD PRESSURE KIT: PACK | 0 refills | Status: DC

## 2019-02-16 NOTE — Progress Notes (Signed)
   TELEHEALTH VIRTUAL OBSTETRICS PRENATAL VISIT ENCOUNTER NOTE  I connected with Tonya Martin on 02/16/19 at 10:30 AM EDT by WebEx at home and verified that I am speaking with the correct person using two identifiers.   I discussed the limitations, risks, security and privacy concerns of performing an evaluation and management service by telephone and the availability of in person appointments. I also discussed with the patient that there may be a patient responsible charge related to this service. The patient expressed understanding and agreed to proceed. Subjective:  Tonya Martin is a 23 y.o. G1P0 at 69w6dbeing seen today for ongoing prenatal care.  She is currently monitored for the following issues for this low-risk pregnancy and has Adjustment disorder with mixed anxiety and depressed mood; Dysmenorrhea; Food allergy; Other seasonal allergic rhinitis; Vaginal discharge; and Supervision of normal pregnancy, antepartum on their problem list.  Patient reports no complaints.  Reports fetal movement. Contractions: Not present. Vag. Bleeding: None.  Movement: Absent. Denies any contractions, bleeding or leaking of fluid.   The following portions of the patient's history were reviewed and updated as appropriate: allergies, current medications, past family history, past medical history, past social history, past surgical history and problem list.   Objective:  There were no vitals filed for this visit.  Fetal Status:     Movement: Absent     General:  Alert, oriented and cooperative. Patient is in no acute distress.  Respiratory: Normal respiratory effort, no problems with respiration noted  Mental Status: Normal mood and affect. Normal behavior. Normal judgment and thought content.  Rest of physical exam deferred due to type of encounter  Assessment and Plan:  Pregnancy: G1P0 at 184w6d. Encounter for supervision of normal pregnancy, antepartum, unspecified gravidity Rx: - Blood Pressure KIT;  Monitor BP weekly  Dispense: 1 each; Refill: 0 - US[place]FM OB COMP + 14 WK; Future - AFP, Serum, Open Spina Bifida; Future  Preterm labor symptoms and general obstetric precautions including but not limited to vaginal bleeding, contractions, leaking of fluid and fetal movement were reviewed in detail with the patient. I discussed the assessment and treatment plan with the patient. The patient was provided an opportunity to ask questions and all were answered. The patient agreed with the plan and demonstrated an understanding of the instructions. The patient was advised to call back or seek an in-person office evaluation/[facility] or concerning [facility]. Please refer to After Visit Summary for other counseling recommendations.   I provided 10 minutes of face-to-face via WebEx time during this encounter.  [facility] in about 4 [facility] (around [facility]16/2019[facility]  Date Time Provider [place]5/26/2020 10:45 AM WH-MFC [place] WH-MFCUS MFC-US     , MDDowagiacor [facility] 02-16-2019

## 2019-02-16 NOTE — Progress Notes (Signed)
Pt presents for webex visit. Pt identified with two pt identifiers. She is [redacted]w[redacted]d.Cuff was sent to pt. Pt has no concerns.

## 2019-03-02 ENCOUNTER — Other Ambulatory Visit: Payer: Self-pay

## 2019-03-02 ENCOUNTER — Other Ambulatory Visit (HOSPITAL_COMMUNITY): Payer: Self-pay | Admitting: *Deleted

## 2019-03-02 ENCOUNTER — Ambulatory Visit (HOSPITAL_COMMUNITY)
Admission: RE | Admit: 2019-03-02 | Discharge: 2019-03-02 | Disposition: A | Discharge status: Home / Self Care | Payer: Medicaid Other | Source: Ambulatory Visit | Attending: Obstetrics and Gynecology | Admitting: Obstetrics and Gynecology

## 2019-03-02 DIAGNOSIS — Z3402 Encounter for supervision of normal first pregnancy, second trimester: Secondary | ICD-10-CM

## 2019-03-02 DIAGNOSIS — Z362 Encounter for other antenatal screening follow-up: Secondary | ICD-10-CM

## 2019-03-02 DIAGNOSIS — Z3A19 19 weeks gestation of pregnancy: Secondary | ICD-10-CM | POA: Diagnosis present

## 2019-03-02 DIAGNOSIS — Z3490 Encounter for supervision of normal pregnancy, unspecified, unspecified trimester: Secondary | ICD-10-CM | POA: Diagnosis present

## 2019-03-02 DIAGNOSIS — Z3A18 18 weeks gestation of pregnancy: Secondary | ICD-10-CM

## 2019-03-02 DIAGNOSIS — Z363 Encounter for antenatal screening for malformations: Secondary | ICD-10-CM

## 2019-03-16 ENCOUNTER — Encounter: Payer: Self-pay | Admitting: Obstetrics

## 2019-03-16 ENCOUNTER — Ambulatory Visit (INDEPENDENT_AMBULATORY_CARE_PROVIDER_SITE_OTHER): Payer: Medicaid Other | Admitting: Obstetrics

## 2019-03-16 DIAGNOSIS — Z3A20 20 weeks gestation of pregnancy: Secondary | ICD-10-CM

## 2019-03-16 DIAGNOSIS — Z3490 Encounter for supervision of normal pregnancy, unspecified, unspecified trimester: Secondary | ICD-10-CM

## 2019-03-16 DIAGNOSIS — Z3492 Encounter for supervision of normal pregnancy, unspecified, second trimester: Secondary | ICD-10-CM

## 2019-03-16 NOTE — Progress Notes (Signed)
TELEHEALTH OBSTETRICS PRENATAL VIRTUAL VIDEO VISIT ENCOUNTER NOTE  [facility]   I connected with Tonya CoolerSierra Stephanie on 03/16/19 at  1:00 PM EDT by WebEx OB MyChart Video Encounter at home and verified that I am speaking with the correct person using two identifiers.   I discussed the limitations, risks, security and privacy concerns of performing an evaluation and management service by telephone and the availability of in person appointments. I also discussed with the patient that there may be a patient responsible charge related to this service. The patient expressed understanding and agreed to proceed. Subjective:  Tonya Martin is a 23 y.o. G1P0 at 3296w6d being seen today for ongoing prenatal care.  She is currently monitored for the following issues for this low-risk pregnancy and has Adjustment disorder with mixed anxiety and depressed mood; Dysmenorrhea; Food allergy; Other seasonal allergic rhinitis; Vaginal discharge; and Supervision of normal pregnancy, antepartum on their problem list.  Patient reports no complaints.  Contractions: Not present. Vag. Bleeding: None.  Movement: Present. Denies any leaking of fluid.   The following portions of the patient's history were reviewed and updated as appropriate: allergies, current medications, past family history, past medical history, past social history, past surgical history and problem list.   Objective:  There were no vitals filed for this visit.  Fetal Status:     Movement: Present     General:  Alert, oriented and cooperative. Patient is in no acute distress.  Respiratory: Normal respiratory effort, no problems with respiration noted  Mental Status: Normal mood and affect. Normal behavior. Normal judgment and thought content.  Rest of physical exam deferred due to type of encounter  Imaging: Koreas Mfm Ob Comp + 14 Wk  Result Date: 03/02/2019  ----------------------------------------------------------------------  OBSTETRICS REPORT                       (Signed Final 03/02/2019 04:27 pm) ---------------------------------------------------------------------- Patient Info  ID #:       [id number]                          D.O.B.:  12/22/1995 (22 yrs)  Name:       Tonya CoolerSIERRA Dant                     Visit Date: 03/02/2019 10:55 am ---------------------------------------------------------------------- Performed By  Performed By:     Sandi MealyJovancia Adrien        Ref. Address:      [address]                    [address]                                                              [address]                                                              [place]  Attending:        Noralee Spaceavi Shankar MD  [facility]  Referred By:      Kathie Dike Ridgely ---------------------------------------------------------------------- Orders   #  Description                          Code         Ordered By   1  [place] MFM OB COMP + 14 WK               76805.01     LISA LEFTWICH-                                                        KIRBY  ----------------------------------------------------------------------   #  Order #                    Accession #                 Episode #   1  [id number]                  [id number]                  [id number]  ---------------------------------------------------------------------- Indications   Encounter for antenatal screening for          Z36.3   malformations (Declined GC)   [redacted] weeks gestation of pregnancy                Z3A.18  ---------------------------------------------------------------------- Vital Signs  Weight (lb): 167                               Height:        5'5"  BMI:         27.79 ---------------------------------------------------------------------- Fetal Evaluation  Num Of Fetuses:          1  Fetal Heart  Rate(bpm):   138  Cardiac Activity:        Observed  Presentation:            Variable  Placenta:                Anterior  P. Cord Insertion:       Visualized  Amniotic Fluid  AFI FV:      Within normal limits                              Largest Pocket(cm)                              5.11 ---------------------------------------------------------------------- Biometry  BPD:      41.7  mm  G. Age:  18w 4d         40  %    CI:          81.5  %    70 - 86                                                          FL/HC:       20.0  %    16.1 - 18.3  HC:      145.8  mm     G. Age:  17w 5d          6  %    HC/AC:       1.14       1.09 - 1.39  AC:      127.6  mm     G. Age:  18w 3d         29  %    FL/BPD:      70.0  %  FL:       29.2  mm     G. Age:  19w 0d         48  %    FL/AC:       22.9  %    20 - 24  CER:        19  mm     G. Age:  18w 4d         39  %  NFT:         5  mm  LV:          7  mm  CM:        2.1  mm  Est. FW:     246   gm     0 lb 9 oz     39  % ---------------------------------------------------------------------- OB History  Gravidity:    1  Living:       0 ---------------------------------------------------------------------- Gestational Age  LMP:           18w 6d        Date:  10/21/18                 EDD:   07/28/19  U/S Today:     18w 3d                                        EDD:   07/31/19  Best:          18w 6d     Det. By:  LMP  (10/21/18)          EDD:   07/28/19 ---------------------------------------------------------------------- Anatomy  Cranium:               Appears normal         Aortic Arch:            Not well visualized  Cavum:                 Appears normal         Ductal Arch:            Not well visualized  Ventricles:  Appears normal         Diaphragm:              Not well visualized  Choroid Plexus:        Appears normal         Stomach:                Appears normal, left                                                                        sided  Cerebellum:             Appears normal         Abdomen:                Appears normal  Posterior Fossa:       Appears normal         Abdominal Wall:         Appears nml (cord                                                                        insert, abd wall)  Nuchal Fold:           Appears normal         Cord Vessels:           Appears normal (3                                                                        vessel cord)  Face:                  Appears normal         Kidneys:                Appear normal                         (orbits and profile)  Lips:                  Appears normal         Bladder:                Not well visualized  Thoracic:              Appears normal         Spine:                  Ltd views no  intracranial signs of                                                                        NTD  Heart:                 Not well visualized    Upper Extremities:      Not well visualized  RVOT:                  Not well visualized    Lower Extremities:      Appears normal  LVOT:                  Not well visualized  Other:  Female gender. Nasal bone visualized. Technically difficult due to fetal          position. ---------------------------------------------------------------------- Cervix Uterus Adnexa  Cervix  Length:           4.82  cm.  Normal appearance by transabdominal scan.  Uterus  No abnormality visualized.  Left Ovary  Within normal limits.  Right Ovary  Not visualized.  Cul De Sac  No free fluid seen.  Adnexa  No abnormality visualized. No adnexal mass  visualized. No free fluid. ---------------------------------------------------------------------- Impression  We performed fetal anatomy scan. No makers of  aneuploidies or fetal structural defects are seen. Fetal  biometry is consistent with her previously-established dates.  Amniotic fluid is normal and good fetal activity is seen.  Patient has not had screening for fetal  aneuploidies. ---------------------------------------------------------------------- Recommendations  An appointment was made for her to return in 4 weeks for  completion of fetal anatomy. ----------------------------------------------------------------------                  Noralee Space, MD Electronically Signed Final Report   03/02/2019 04:27 pm ----------------------------------------------------------------------   Assessment and Plan:  Pregnancy: G1P0 at [redacted]w[redacted]d 1. Encounter for supervision of normal pregnancy, antepartum, unspecified gravidity   Preterm labor symptoms and general obstetric precautions including but not limited to vaginal bleeding, contractions, leaking of fluid and fetal movement were reviewed in detail with the patient. I discussed the assessment and treatment plan with the patient. The patient was provided an opportunity to ask questions and all were answered. The patient agreed with the plan and demonstrated an understanding of the instructions. The patient was advised to call back or seek an in-person office evaluation/go to [facility]U at [facility] for any urgent or concerning symptoms. Please refer to After Visit Summary for other counseling recommendations.   I provided 10 minutes of face-to-face time during this encounter.  Return in about 4 weeks (around 04/13/2019) for [facility].  Future Appointments  Date Time Provider Department Center  03/30/2019 11:00 AM WH-MFC [place] 3 WH-MFCUS MFC-US    Coral Ceo, MD Center for [facility], [facility] 03-16-2019

## 2019-03-16 NOTE — Progress Notes (Signed)
CC: None  

## 2019-03-30 ENCOUNTER — Ambulatory Visit (HOSPITAL_COMMUNITY)
Admission: RE | Admit: 2019-03-30 | Discharge: 2019-03-30 | Disposition: A | Discharge status: Home / Self Care | Payer: Medicaid Other | Source: Ambulatory Visit | Attending: Obstetrics and Gynecology | Admitting: Obstetrics and Gynecology

## 2019-03-30 ENCOUNTER — Other Ambulatory Visit: Payer: Self-pay

## 2019-03-30 DIAGNOSIS — O359XX0 Maternal care for (suspected) fetal abnormality and damage, unspecified, not applicable or unspecified: Secondary | ICD-10-CM

## 2019-03-30 DIAGNOSIS — Z3A22 22 weeks gestation of pregnancy: Secondary | ICD-10-CM

## 2019-03-30 DIAGNOSIS — Z362 Encounter for other antenatal screening follow-up: Secondary | ICD-10-CM | POA: Insufficient documentation

## 2019-03-31 ENCOUNTER — Other Ambulatory Visit (HOSPITAL_COMMUNITY): Payer: Self-pay | Admitting: *Deleted

## 2019-03-31 DIAGNOSIS — O35EXX0 Maternal care for other (suspected) fetal abnormality and damage, fetal genitourinary anomalies, not applicable or unspecified: Secondary | ICD-10-CM

## 2019-03-31 DIAGNOSIS — O358XX0 Maternal care for other (suspected) fetal abnormality and damage, not applicable or unspecified: Secondary | ICD-10-CM

## 2019-04-07 ENCOUNTER — Encounter (HOSPITAL_COMMUNITY): Payer: Self-pay | Admitting: Emergency Medicine

## 2019-04-07 ENCOUNTER — Ambulatory Visit (HOSPITAL_COMMUNITY)
Admission: EM | Admit: 2019-04-07 | Discharge: 2019-04-07 | Disposition: A | Discharge status: Home / Self Care | Payer: Medicaid Other | Attending: Internal Medicine | Admitting: Internal Medicine

## 2019-04-07 ENCOUNTER — Other Ambulatory Visit: Payer: Self-pay

## 2019-04-07 DIAGNOSIS — H00025 Hordeolum internum left lower eyelid: Secondary | ICD-10-CM | POA: Diagnosis not present

## 2019-04-07 MED ORDER — BACITRACIN-POLYMYXIN B 500-10000 UNIT/GM OP OINT: 1.0000 "application " | TOPICAL_OINTMENT | Freq: Two times a day (BID) | OPHTHALMIC | 0 refills | Status: DC

## 2019-04-07 MED ORDER — BACITRACIN-POLYMYXIN B 500-10000 UNIT/GM OP OINT: TOPICAL_OINTMENT | Freq: Two times a day (BID) | OPHTHALMIC | Status: DC

## 2019-04-07 NOTE — ED Triage Notes (Signed)
PT has pain and swelling to left lower eyelid

## 2019-04-07 NOTE — ED Provider Notes (Signed)
[place]    CSN: [id number] Arrival date & time: 04/07/19  1505      History   Chief Complaint Chief Complaint  Patient presents with  . Eye Problem    HPI Tonya Martin is a 23 y.o. female comes to urgent care with complaints of left lower eyelid swelling of 1 week duration.  Patient said symptoms started insidiously and is gotten progressively worse.  She has used warm compress over the lower eyelid with no improvement.  She denies any difficulty with opening or closing of her eyes.  No discharge from the eye.  No fever or chills.  No double vision.   HPI  Past Medical History:  Diagnosis Date  . Allergy   . Vision abnormalities     Patient Active Problem List   Diagnosis Date Noted  . Supervision of normal pregnancy, antepartum 01/19/2019  . Vaginal discharge 08/11/2017  . Other seasonal allergic rhinitis 12/27/2015  . Adjustment disorder with mixed anxiety and depressed mood 04/13/2013  . Dysmenorrhea 04/13/2013  . Food allergy 04/13/2013    History reviewed. No pertinent surgical history.  OB History    Gravida  1   Para      Term      Preterm      AB      Living        SAB      TAB      Ectopic      Multiple      Live Births               Home Medications    Prior to Admission medications   Medication Sig Start Date End Date Taking? Authorizing Provider  Prenatal Vit-Fe Fumarate-FA (PRENATAL VITAMIN) 27-0.8 MG TABS Take 1 tablet by mouth daily. 12/15/18  Yes Jonathon Resides T, FNP  Blood Pressure KIT Monitor BP weekly 02/16/19   Shelly Bombard, MD    Family History Family History  Problem Relation Age of Onset  . Cancer Mother   . Diabetes Mother   . Hyperlipidemia Mother   . Hypertension Mother   . Diabetes Maternal Grandmother   . Hypertension Maternal Grandmother     Social History Social History   Tobacco Use  . Smoking status: Never Smoker  . Smokeless tobacco: Never Used  . Tobacco comment:  marijuana   Substance Use Topics  . Alcohol use: Never    Alcohol/week: 0.0 standard drinks    Frequency: Never  . Drug use: Not Currently     Allergies   Peanut-containing drug products and Flonase [fluticasone propionate]   Review of Systems Review of Systems  Constitutional: Negative for activity change, appetite change, diaphoresis and fatigue.  HENT: Negative.   Eyes: Positive for pain. Negative for photophobia, discharge, redness, itching and visual disturbance.  Respiratory: Negative.   Cardiovascular: Negative.   Gastrointestinal: Negative.   Genitourinary: Negative.   Musculoskeletal: Negative.   Neurological: Negative for dizziness.     Physical Exam Triage Vital Signs ED Triage Vitals [04/07/19 1553]  Enc Vitals Group     BP 127/80     Pulse Rate 66     Resp 16     Temp 99.3 F (37.4 C)     Temp Source Oral     SpO2 99 %     Weight      Height      Head Circumference      Peak Flow      Pain  Score 5     Pain Loc      Pain Edu?      Excl. in [place]?    No data found.  Updated Vital Signs BP 127/80   Pulse 66   Temp 99.3 F (37.4 C) (Oral)   Resp 16   LMP 10/21/2018 (Approximate)   SpO2 99%   Visual Acuity Right Eye Distance:   Left Eye Distance:   Bilateral Distance:    Right Eye Near:   Left Eye Near:    Bilateral Near:     Physical Exam Vitals signs reviewed.  Constitutional:      General: She is not in acute distress.    Appearance: Normal appearance. She is not ill-appearing or toxic-appearing.  HENT:     Right Ear: Tympanic membrane normal.     Left Ear: Tympanic membrane normal.     Nose: No rhinorrhea.     Mouth/Throat:     Mouth: Mucous membranes are moist.  Eyes:     General:        Right eye: No discharge.        Left eye: No discharge.     Extraocular Movements: Extraocular movements intact.     Conjunctiva/sclera: Conjunctivae normal.     Pupils: Pupils are equal, round, and reactive to light.     Comments:  Swelling on the medial part of the left lower eyelid.  No erythema of the lower eyelid.  Tenderness to palpation.  Neurological:     Mental Status: She is alert.      UC Treatments / Results  Labs (all labs ordered are listed, but only abnormal results are displayed) Labs Reviewed - No data to display  EKG   Radiology No results found.  Procedures Procedures (including critical care time)  Medications Ordered in UC Medications  bacitracin-polymyxin b (POLYSPORIN) ophthalmic ointment (has no administration in time range)    Initial Impression / Assessment and Plan / UC Course  I have reviewed the triage vital signs and the nursing notes.  Pertinent labs & imaging results that were available during my care of the patient were reviewed by me and considered in my medical decision making (see chart for details).     1.  Left lower eyelid stye: Bacitracin-polymyxin B eye ointment twice daily Continue warm compresses If there is no improvement patient needs to return to urgent care to be reevaluated.  If abscess worsens patient may need incision and drainage by an ophthalmologist. Final Clinical Impressions(s) / UC Diagnoses   Final diagnoses:  Hordeolum internum of left lower eyelid   Discharge Instructions   None    ED Prescriptions    None     Controlled Substance Prescriptions [place] Controlled Substance Registry consulted? No   Chase Picket, MD 04/07/19 [phone]

## 2019-04-08 ENCOUNTER — Other Ambulatory Visit: Payer: Medicaid Other

## 2019-04-08 DIAGNOSIS — Z3490 Encounter for supervision of normal pregnancy, unspecified, unspecified trimester: Secondary | ICD-10-CM

## 2019-04-10 ENCOUNTER — Other Ambulatory Visit: Payer: Self-pay

## 2019-04-10 ENCOUNTER — Emergency Department (HOSPITAL_COMMUNITY)
Admission: EM | Admit: 2019-04-10 | Discharge: 2019-04-10 | Disposition: A | Discharge status: Home / Self Care | Payer: Medicaid Other | Attending: Emergency Medicine | Admitting: Emergency Medicine

## 2019-04-10 ENCOUNTER — Encounter (HOSPITAL_COMMUNITY): Payer: Self-pay | Admitting: *Deleted

## 2019-04-10 DIAGNOSIS — Z79899 Other long term (current) drug therapy: Secondary | ICD-10-CM | POA: Diagnosis not present

## 2019-04-10 DIAGNOSIS — O1202 Gestational edema, second trimester: Secondary | ICD-10-CM | POA: Insufficient documentation

## 2019-04-10 DIAGNOSIS — O9989 Other specified diseases and conditions complicating pregnancy, childbirth and the puerperium: Secondary | ICD-10-CM | POA: Insufficient documentation

## 2019-04-10 DIAGNOSIS — R030 Elevated blood-pressure reading, without diagnosis of hypertension: Secondary | ICD-10-CM | POA: Diagnosis not present

## 2019-04-10 DIAGNOSIS — Z91010 Allergy to peanuts: Secondary | ICD-10-CM | POA: Diagnosis not present

## 2019-04-10 DIAGNOSIS — Z3A24 24 weeks gestation of pregnancy: Secondary | ICD-10-CM

## 2019-04-10 LAB — COMPREHENSIVE METABOLIC PANEL
ALT: 18 U/L (ref 0–44)
AST: 25 U/L (ref 15–41)
Albumin: 3.7 g/dL (ref 3.5–5.0)
Alkaline Phosphatase: 43 U/L (ref 38–126)
Anion gap: 8 (ref 5–15)
BUN: 10 mg/dL (ref 6–20)
CO2: 22 mmol/L (ref 22–32)
Calcium: 8.9 mg/dL (ref 8.9–10.3)
Chloride: 106 mmol/L (ref 98–111)
Creatinine, Ser: 0.6 mg/dL (ref 0.44–1.00)
GFR calc Af Amer: >60 mL/min (ref >60)
GFR calc non Af Amer: >60 mL/min (ref >60)
Glucose, Bld: 90 mg/dL (ref 70–99)
Potassium: 3.8 mmol/L (ref 3.5–5.1)
Sodium: 136 mmol/L (ref 135–145)
Total Bilirubin: 0.3 mg/dL (ref 0.3–1.2)
Total Protein: 7.2 g/dL (ref 6.5–8.1)

## 2019-04-10 LAB — URINALYSIS, ROUTINE W REFLEX MICROSCOPIC
Bilirubin Urine: NEGATIVE
Glucose, UA: NEGATIVE mg/dL
Hgb urine dipstick: NEGATIVE
Ketones, ur: NEGATIVE mg/dL
Leukocytes,Ua: NEGATIVE
Nitrite: NEGATIVE
Protein, ur: NEGATIVE mg/dL
Specific Gravity, Urine: 1.014 (ref 1.005–1.030)
pH: 7 (ref 5.0–8.0)

## 2019-04-10 LAB — CBC WITH DIFFERENTIAL/PLATELET
Abs Immature Granulocytes: 0.04 10*3/uL (ref 0.00–0.07)
Basophils Absolute: 0 10*3/uL (ref 0.0–0.1)
Basophils Relative: 0 %
Eosinophils Absolute: 0.1 10*3/uL (ref 0.0–0.5)
Eosinophils Relative: 1 %
HCT: 33.5 % — ABNORMAL LOW (ref 36.0–46.0)
Hemoglobin: 11.6 g/dL — ABNORMAL LOW (ref 12.0–15.0)
Immature Granulocytes: 0 %
Lymphocytes Relative: 11 %
Lymphs Abs: 1.1 10*3/uL (ref 0.7–4.0)
MCH: 31.2 pg (ref 26.0–34.0)
MCHC: 34.6 g/dL (ref 30.0–36.0)
MCV: 90.1 fL (ref 80.0–100.0)
Monocytes Absolute: 1.2 10*3/uL — ABNORMAL HIGH (ref 0.1–1.0)
Monocytes Relative: 12 %
Neutro Abs: 7.7 10*3/uL (ref 1.7–7.7)
Neutrophils Relative %: 76 %
Platelets: 263 10*3/uL (ref 150–400)
RBC: 3.72 MIL/uL — ABNORMAL LOW (ref 3.87–5.11)
RDW: 12.9 % (ref 11.5–15.5)
WBC: 10.2 10*3/uL (ref 4.0–10.5)
nRBC: 0 % (ref 0.0–0.2)

## 2019-04-10 NOTE — ED Triage Notes (Signed)
Pt reports that she has bilateral leg and ankle swelling. 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 Pt reports pain when her ankles and legs are swollen. Pt denies any other symptoms.

## 2019-04-10 NOTE — Discharge Instructions (Addendum)
You have been diagnosed today with elevated blood pressure reading, [redacted] weeks gestation of pregnancy.  At this time there does not appear to be the presence of an emergent medical condition, however there is always the potential for conditions to change. Please read and follow the below instructions.  Please return to the Emergency Department immediately for any new or worsening symptoms. Please be sure to follow up with your Primary Care Provider within one week regarding your visit today; please call their office to schedule an appointment even if you are feeling better for a follow-up visit. Please call your OB/GYN office tomorrow morning to inform them of your elevated blood pressure reading, this will need to be followed closely by your doctor, please schedule appointment within the next week for blood pressure recheck and management.  Get help right away if: You have: Abdominal pain A headache that does not get better. Vomiting that does not get better. Sudden, rapid weight gain. Sudden swelling in your hands, ankles, or face. Vaginal bleeding. Blood in your urine. Blurred or double vision. Shortness of breath or chest pain. Weakness on one side of your body. Difficulty speaking. Dizziness or lightheadedness. Your baby is not moving as much as usual.  Please read the additional information packets attached to your discharge summary.

## 2019-04-10 NOTE — ED Provider Notes (Signed)
[facility] DEPT Provider Note   CSN: [id number] Arrival date & time: 04/10/19  1714    History   Chief Complaint Chief Complaint  Patient presents with  . Leg Swelling    bilateral    HPI Tonya Martin is a 23 y.o. female pregnant followed by [facility] clinic regularly presents today for bilateral lower extremity swelling.  Patient reports that she started working in [employer] 2 weeks ago and has had swelling since that time, she reports that over the course of her 5-hour shift she has had increased swelling to her bilateral lower extremities, equal both sides and then after she leaves work and elevates her feet the swelling then subsides.  Patient denies pain/color change, injury, shortness of breath, chest pain, abdominal pain, vaginal bleeding/fluid loss or any additional concerns.  Patient states that she wanted to have off of work today and was informed that she needed a work note to return on Monday.  Of note patient currently being treated with Polytrim and warm compresses for her portal and of her left eye.  She was seen in urgent care on 04/07/2019, improving symptoms and does not want to be evaluated for this.    HPI  Past Medical History:  Diagnosis Date  . Allergy   . Vision abnormalities     Patient Active Problem List   Diagnosis Date Noted  . Supervision of normal pregnancy, antepartum 01/19/2019  . Vaginal discharge 08/11/2017  . Other seasonal allergic rhinitis 12/27/2015  . Adjustment disorder with mixed anxiety and depressed mood 04/13/2013  . Dysmenorrhea 04/13/2013  . Food allergy 04/13/2013    History reviewed. No pertinent surgical history.   OB History    Gravida  1   Para      Term      Preterm      AB      Living        SAB      TAB      Ectopic      Multiple      Live Births               Home Medications    Prior to Admission medications   Medication Sig Start Date End Date Taking?  Authorizing Provider  bacitracin-polymyxin b (POLYSPORIN) ophthalmic ointment Place 1 application into both eyes 2 (two) times daily. apply to eye every 12 hours while awake 04/07/19   Chase Picket, MD  Blood Pressure KIT Monitor BP weekly 02/16/19   Shelly Bombard, MD  Prenatal Vit-Fe Fumarate-FA (PRENATAL VITAMIN) 27-0.8 MG TABS Take 1 tablet by mouth daily. 12/15/18   Trude Mcburney, FNP    Family History Family History  Problem Relation Age of Onset  . Cancer Mother   . Diabetes Mother   . Hyperlipidemia Mother   . Hypertension Mother   . Diabetes Maternal Grandmother   . Hypertension Maternal Grandmother     Social History Social History   Tobacco Use  . Smoking status: Never Smoker  . Smokeless tobacco: Never Used  . Tobacco comment: marijuana   Substance Use Topics  . Alcohol use: Never    Alcohol/week: 0.0 standard drinks    Frequency: Never  . Drug use: Not Currently     Allergies   Peanut-containing drug products and Flonase [fluticasone propionate]   Review of Systems Review of Systems  Constitutional: Negative.  Negative for chills and fever.  Eyes: Negative.  Negative for visual disturbance.  Respiratory: Negative.  Negative for cough and shortness of breath.   Cardiovascular: Positive for leg swelling. Negative for chest pain and palpitations.  Gastrointestinal: Negative.  Negative for abdominal pain, diarrhea, nausea and vomiting.  Genitourinary: Negative.  Negative for dysuria, hematuria, menstrual problem, pelvic pain, vaginal bleeding and vaginal discharge.  Musculoskeletal: Negative.  Negative for arthralgias and myalgias.  Skin: Negative.  Negative for color change and rash.  Neurological: Negative.  Negative for dizziness, weakness, numbness and headaches.  All other systems reviewed and are negative.  Physical Exam Updated Vital Signs BP 140/83 (BP Location: Right Arm)   Pulse 80   Temp 98.1 F (36.7 C) (Oral)   Resp 16   LMP  10/21/2018 (Approximate)   SpO2 99%   Physical Exam Constitutional:      General: She is not in acute distress.    Appearance: Normal appearance. She is well-developed. She is not ill-appearing or diaphoretic.  HENT:     Head: Normocephalic and atraumatic.     Right Ear: External ear normal.     Left Ear: External ear normal.     Nose: Nose normal.  Eyes:     General: Vision grossly intact. Gaze aligned appropriately.     Extraocular Movements: Extraocular movements intact.     Conjunctiva/sclera: Conjunctivae normal.     Pupils: Pupils are equal, round, and reactive to light.     Comments: Hordeolum present left eye.  Neck:     Musculoskeletal: Normal range of motion.     Trachea: Trachea and phonation normal. No tracheal deviation.  Cardiovascular:     Rate and Rhythm: Normal rate and regular rhythm.     Pulses:          Dorsalis pedis pulses are 2+ on the right side and 2+ on the left side.       Posterior tibial pulses are 2+ on the right side and 2+ on the left side.     Heart sounds: Normal heart sounds.  Pulmonary:     Effort: Pulmonary effort is normal. No accessory muscle usage or respiratory distress.     Breath sounds: Normal breath sounds and air entry.  Abdominal:     Palpations: Abdomen is soft.     Tenderness: There is no abdominal tenderness. There is no guarding or rebound.     Comments: Pregnant abdomen  Musculoskeletal: Normal range of motion.        General: No tenderness, deformity or signs of injury.     Comments: No appreciable swelling in lower extremities on my exam.  Feet:     Right foot:     Protective Sensation: 3 sites tested. 3 sites sensed.     Left foot:     Protective Sensation: 3 sites sensed.  Skin:    General: Skin is warm and dry.  Neurological:     Mental Status: She is alert.     GCS: GCS eye subscore is 4. GCS verbal subscore is 5. GCS motor subscore is 6.     Comments: Speech is clear and goal oriented, follows commands Major  Cranial nerves without deficit, no facial droop Moves extremities without ataxia, coordination intact  Psychiatric:        Behavior: Behavior normal.    ED Treatments / Results  Labs (all labs ordered are listed, but only abnormal results are displayed) Labs Reviewed  CBC WITH DIFFERENTIAL/PLATELET - Abnormal; Notable for the following components:      Result Value   RBC  3.72 (*)    Hemoglobin 11.6 (*)    HCT 33.5 (*)    Monocytes Absolute 1.2 (*)    All other components within normal limits  COMPREHENSIVE METABOLIC PANEL  URINALYSIS, ROUTINE W REFLEX MICROSCOPIC    EKG None  Radiology No results found.  Procedures Procedures (including critical care time)  Medications Ordered in ED Medications - No data to display   Initial Impression / Assessment and Plan / ED Course  I have reviewed the triage vital signs and the nursing notes.  Pertinent labs & imaging results that were available during my care of the patient were reviewed by me and considered in my medical decision making (see chart for details).    CBC nonacute CMP within normal limits Urinalysis normal limits - Patient blood pressure 140/83 on arrival,, [redacted] weeks pregnant, no proteinuria or other signs of preeclampsia today.  No lower extremity swelling on my examination, neurovascular intact bilateral lower extremities.  Notification for further work-up.  The ED.  Patient has been informed to contact her OB/GYN tomorrow morning and inform them of elevated blood pressure reading and that she needs a follow-up appointment for blood pressure recheck and medication management.  She has been given work note.  At this time there does not appear to be any evidence of an acute emergency medical condition and the patient appears stable for discharge with appropriate outpatient follow up. Diagnosis was discussed with patient who verbalizes understanding of care plan and is agreeable to discharge. I have discussed return  precautions with patient and who verbalizes understanding of return precautions. Patient encouraged to follow-up with their PCP and OB/GYN. all questions answered.  Patient's case discussed with Dr. Tomi Bamberger who agrees with plan to discharge with OBGYN follow-up.   Note: Portions of this report may have been transcribed using voice recognition software. Every effort was made to ensure accuracy; however, inadvertent computerized transcription errors may still be present. Final Clinical Impressions(s) / ED Diagnoses   Final diagnoses:  Elevated blood pressure reading  [redacted] weeks gestation of pregnancy    ED Discharge Orders    None       Gari Crown 04/10/19 1916    Dorie Rank, MD 04/11/19 [phone]

## 2019-04-13 ENCOUNTER — Ambulatory Visit (INDEPENDENT_AMBULATORY_CARE_PROVIDER_SITE_OTHER): Payer: Medicaid Other | Admitting: Obstetrics

## 2019-04-13 ENCOUNTER — Encounter: Payer: Self-pay | Admitting: Obstetrics

## 2019-04-13 VITALS — BP 146/82 | HR 86

## 2019-04-13 DIAGNOSIS — H00015 Hordeolum externum left lower eyelid: Secondary | ICD-10-CM

## 2019-04-13 DIAGNOSIS — Z3A24 24 weeks gestation of pregnancy: Secondary | ICD-10-CM

## 2019-04-13 DIAGNOSIS — Z3490 Encounter for supervision of normal pregnancy, unspecified, unspecified trimester: Secondary | ICD-10-CM

## 2019-04-13 DIAGNOSIS — O26892 Other specified pregnancy related conditions, second trimester: Secondary | ICD-10-CM

## 2019-04-13 MED ORDER — COMFORT FIT MATERNITY SUPP LG MISC: 1.0000 | Freq: Once | 0 refills | Status: AC

## 2019-04-13 NOTE — Progress Notes (Signed)
TELEHEALTH OBSTETRICS PRENATAL VIRTUAL VIDEO VISIT ENCOUNTER NOTE  [facility]   I connected with Tonya CoolerSierra Merry on 04/13/19 at  2:00 PM EDT by WebEx OB MyChart Video Encounter at home and verified that I am speaking with the correct person using two identifiers.   I discussed the limitations, risks, security and privacy concerns of performing an evaluation and management service virtually and the availability of in person appointments. I also discussed with the patient that there may be a patient responsible charge related to this service. The patient expressed understanding and agreed to proceed. Subjective:  Tonya Martin is a 23 y.o. G1P0 at 36106w6d being seen today for ongoing prenatal care.  She is currently monitored for the following issues for this low-risk pregnancy and has Adjustment disorder with mixed anxiety and depressed mood; Dysmenorrhea; Food allergy; Other seasonal allergic rhinitis; Vaginal discharge; and Supervision of normal pregnancy, antepartum on their problem list.  Patient reports irritation left eye.  Contractions: Not present. Vag. Bleeding: None.  Movement: Present. Denies any leaking of fluid.   The following portions of the patient's history were reviewed and updated as appropriate: allergies, current medications, past family history, past medical history, past social history, past surgical history and problem list.   Objective:   Vitals:   04/13/19 1415  BP: (!) 146/82  Pulse: 86    Fetal Status:     Movement: Present     General:  Alert, oriented and cooperative. Patient is in no acute distress.  Respiratory: Normal respiratory effort, no problems with respiration noted  Mental Status: Normal mood and affect. Normal behavior. Normal judgment and thought content.  Rest of physical exam deferred due to type of encounter  Imaging: [place] M[facility] Ob Follow Up  Result Date: 03/30/2019  ----------------------------------------------------------------------  OBSTETRICS REPORT                    (Corrected Final 03/30/2019 02:26 pm) ---------------------------------------------------------------------- Patient Info  ID #:       [id number]                          D.O.B.:  Mar 17, 1996 (22 yrs)  Name:       Tonya Martin                     Visit Date: 03/30/2019 11:32 am ---------------------------------------------------------------------- Performed By  Performed By:     Marcellina MillinKelly L Moser          Ref. Address:     [address]                    [address]                                                             [address]                                                             [place]  Attending:        Noralee Spaceavi Shankar  MD        [facility]  Referred By:      Wilmer Floor LEFTWICH-                    Craige Cotta CNM ---------------------------------------------------------------------- Orders   #  Description                          Code         Ordered By   1  [place] MFM OB FOLLOW UP                  16109.60     Lin Landsman  ----------------------------------------------------------------------   #  Order #                    Accession #                 Episode #   1  [id number]                  [id number]                  [id number]  ---------------------------------------------------------------------- Indications   Antenatal follow-up for nonvisualized fetal    Z36.2   anatomy   Fetal abnormality - other known or             O35.9XX0   suspected (EIF, pyelectasis)   [redacted] weeks gestation of pregnancy                Z3A.22  ---------------------------------------------------------------------- Vital Signs                                                 Height:        5'5" ---------------------------------------------------------------------- Fetal Evaluation  Num Of  Fetuses:         1  Fetal Heart Rate(bpm):  138  Cardiac Activity:       Observed  Presentation:           Cephalic  Placenta:               Anterior  P. Cord Insertion:      Visualized  Amniotic Fluid  AFI FV:      Within normal limits                              Largest Pocket(cm)  4 ---------------------------------------------------------------------- Biometry  BPD:      55.2  mm     G. Age:  22w 6d         44  %    CI:        82.35   %    70 - 86                                                          FL/HC:      18.8   %    19.2 - 20.8  HC:      191.9  mm     G. Age:  21w 3d        < 3  %    HC/AC:      1.08        1.05 - 1.21  AC:      176.9  mm     G. Age:  22w 4d         33  %    FL/BPD:     65.4   %    71 - 87  FL:       36.1  mm     G. Age:  21w 3d          7  %    FL/AC:      20.4   %    20 - 24  Est. FW:     472  gm      1 lb 1 oz     32  % ---------------------------------------------------------------------- OB History  Gravidity:    1  Living:       0 ---------------------------------------------------------------------- Gestational Age  LMP:           22w 6d        Date:  10/21/18                 EDD:   07/28/19  U/S Today:     22w 1d                                        EDD:   08/02/19  Best:          22w 6d     Det. By:  LMP  (10/21/18)          EDD:   07/28/19 ---------------------------------------------------------------------- Anatomy  Cranium:               Appears normal         Aortic Arch:            Appears normal  Cavum:                 Appears normal         Ductal Arch:            Appears normal  Ventricles:            Appears normal         Diaphragm:              Appears normal  Choroid Plexus:        Appears normal  Stomach:                Appears normal, left                                                                        sided  Cerebellum:            Appears normal         Abdomen:                Appears normal  Posterior Fossa:        Appears normal         Abdominal Wall:         Previously seen  Nuchal Fold:           Previously seen        Cord Vessels:           Previously seen  Face:                  Orbits and profile     Kidneys:                Bilat UTD R4.312mm,                         previously seen                                                                        L4.9  Lips:                  Previously seen        Bladder:                Appears normal  Thoracic:              Appears normal         Spine:                  Appears normal  Heart:                 Bilateral              Upper Extremities:      Appears normal                         echogenic focus  RVOT:                  Appears normal         Lower Extremities:      Previously seen  LVOT:                  Appears normal  Other:  Female gender. Nasal bone previously visualized. 5th digit visualized.          Technically difficult due to fetal position. ---------------------------------------------------------------------- Cervix Uterus Adnexa  Cervix  Length:  3.4  cm.  Normal appearance by transabdominal scan. ---------------------------------------------------------------------- Impression  Patient returned for completion of fetal anatomy.  Fetal growth is appropriate for gestational age. Amniotic fluid  is normal and good fetal activity is seen. Fetal anatomical  survey is normal and complete.  Bilateral echogenic intracardiac foci are seen. Bilateral mild  urinary tract dilations are seen (4 to 5 millimeters). Both  kidneys look normal with no increased echogenicities.  I explained that these findings are markers for Down  syndrome. I also reassured her that UTD usually resolves  with advancing gestation or after birth.  I discussed the following options: 1) Maternal blood cell-free  fetal DNA screening  for trisomies 21, 18 and 13, which has a  greater detection rate than conventional screening tests. I  informed the patient that not all chromosomal  malformations  are detected by this test. 2) I informed her that only  amniocentesis will give a definitive result on the fetal  karyotype. I discussed a procedure-related pregnancy loss of  about 1 in 500.  Patient opted not to have cell-free fetal DNA screening or  amniocentesis. She informed that she will think about cell-  free fetal DNA screening and call your office if she wants to  get blood drawn. ---------------------------------------------------------------------- Recommendations  -An appointment was made for her to return in 8 weeks for  fetal growth and renal assessments. ----------------------------------------------------------------------                       Noralee Spaceavi Shankar, MD Electronically Signed Corrected Final Report  03/30/2019 02:26 pm ----------------------------------------------------------------------   Assessment and Plan:  Pregnancy: G1P0 at 6382w6d 1. Encounter for supervision of normal pregnancy, antepartum, unspecified gravidity  2. Stye of left lower eyelid - being evaluated at Urgent Care  3. Backache Rx: - Elastic Bandages & Supports (COMFORT FIT MATERNITY SUPP LG) MISC; 1 each by Does not apply route once for 1 dose.  Dispense: 1 each; Refill: 0  Preterm labor symptoms and general obstetric precautions including but not limited to vaginal bleeding, contractions, leaking of fluid and fetal movement were reviewed in detail with the patient. I discussed the assessment and treatment plan with the patient. The patient was provided an opportunity to ask questions and all were answered. The patient agreed with the plan and demonstrated an understanding of the instructions. The patient was advised to call back or seek an in-person office evaluation/[facility] for any urgent or concerning symptoms. Please refer to After Visit Summary for other counseling recommendations.   I provided 10 minutes of face-to-face time during this encounter.   Return in about 3 weeks (around 05/04/2019) for ROB, 2 hour OGTT.  Future Appointments  Date Time Provider Department Center  05/25/2019 11:00 AM [facility] NURSE [facility]-US  05/25/2019 11:00 AM [facility] US 3 [facility]-US    Coral Ceoharles , MD [facility] 04-13-2019

## 2019-04-13 NOTE — Progress Notes (Signed)
Pt states that she was seen at Urgent Care for eye problem. Pt states it is no better even with medication use.  Pt was advised to follow up at Urgent care for follow up. Pt also states she was recently seen at [facility] ED for LE swelling, pt states it is worse when she is working. Pt works as a Programme researcher, broadcasting/film/video.   Pt is having some lower back pain.  Pt had u/s on 6/23 with some abnormalities.  Please discuss with pt. Pt has had Panorama test on 04/08/19.

## 2019-04-16 ENCOUNTER — Encounter: Payer: Self-pay | Admitting: Obstetrics and Gynecology

## 2019-04-19 ENCOUNTER — Encounter: Payer: Self-pay | Admitting: Obstetrics and Gynecology

## 2019-04-26 ENCOUNTER — Telehealth: Payer: Self-pay

## 2019-04-26 NOTE — Telephone Encounter (Signed)
Called pt to follow up after babyrx alert, no answer, left vm

## 2019-05-04 ENCOUNTER — Other Ambulatory Visit: Payer: Self-pay

## 2019-05-04 ENCOUNTER — Ambulatory Visit (INDEPENDENT_AMBULATORY_CARE_PROVIDER_SITE_OTHER): Payer: Medicaid Other | Admitting: Obstetrics

## 2019-05-04 ENCOUNTER — Encounter: Payer: Self-pay | Admitting: Obstetrics

## 2019-05-04 ENCOUNTER — Other Ambulatory Visit: Payer: Medicaid Other

## 2019-05-04 VITALS — BP 131/81 | HR 84 | Wt 194.7 lb

## 2019-05-04 DIAGNOSIS — Z3A27 27 weeks gestation of pregnancy: Secondary | ICD-10-CM

## 2019-05-04 DIAGNOSIS — Z3490 Encounter for supervision of normal pregnancy, unspecified, unspecified trimester: Secondary | ICD-10-CM

## 2019-05-04 DIAGNOSIS — Z3492 Encounter for supervision of normal pregnancy, unspecified, second trimester: Secondary | ICD-10-CM

## 2019-05-04 NOTE — Progress Notes (Signed)
Pt is here for ROB/2 hr GTT. [redacted]w[redacted]d.

## 2019-05-04 NOTE — Progress Notes (Signed)
Subjective:  Tonya Martin is a 23 y.o. G1P0 at [redacted]w[redacted]d being seen today for ongoing prenatal care.  She is currently monitored for the following issues for this low-risk pregnancy and has Adjustment disorder with mixed anxiety and depressed mood; Dysmenorrhea; Food allergy; Other seasonal allergic rhinitis; Vaginal discharge; and Supervision of normal pregnancy, antepartum on their problem list.  Patient reports no complaints.  Contractions: Not present. Vag. Bleeding: None.  Movement: Present. Denies leaking of fluid.   The following portions of the patient's history were reviewed and updated as appropriate: allergies, current medications, past family history, past medical history, past social history, past surgical history and problem list. Problem list updated.  Objective:   Vitals:   05/04/19 0935 05/04/19 0939  BP: 137/84 131/81  Pulse: 83 84  Weight: 194 lb 11.2 oz (88.3 kg)     Fetal Status:     Movement: Present     General:  Alert, oriented and cooperative. Patient is in no acute distress.  Skin: Skin is warm and dry. No rash noted.   Cardiovascular: Normal heart rate noted  Respiratory: Normal respiratory effort, no problems with respiration noted  Abdomen: Soft, gravid, appropriate for gestational age. Pain/Pressure: Absent     Pelvic:  Cervical exam deferred        Extremities: Normal range of motion.  Edema: Trace  Mental Status: Normal mood and affect. Normal behavior. Normal judgment and thought content.   Urinalysis:      Assessment and Plan:  Pregnancy: G1P0 at [redacted]w[redacted]d  1. Encounter for supervision of normal pregnancy, antepartum, unspecified gravidity Rx: - Glucose Tolerance, 2 Hours w/1 Hour - CBC - RPR - HIV Antibody (routine testing w rflx)  Preterm labor symptoms and general obstetric precautions including but not limited to vaginal bleeding, contractions, leaking of fluid and fetal movement were reviewed in detail with the patient. Please refer to After Visit  Summary for other counseling recommendations.  Return in about 2 weeks (around 05/18/2019) for MyChart.   Shelly Bombard, MD  05-04-2019

## 2019-05-05 LAB — CBC
Hematocrit: 34.9 % (ref 34.0–46.6)
Hemoglobin: 11.6 g/dL (ref 11.1–15.9)
MCH: 30.5 pg (ref 26.6–33.0)
MCHC: 33.2 g/dL (ref 31.5–35.7)
MCV: 92 fL (ref 79–97)
Platelets: 231 10*3/uL (ref 150–450)
RBC: 3.8 x10E6/uL (ref 3.77–5.28)
RDW: 12.8 % (ref 11.7–15.4)
WBC: 10.2 10*3/uL (ref 3.4–10.8)

## 2019-05-05 LAB — RPR: RPR Ser Ql: NONREACTIVE

## 2019-05-05 LAB — HIV ANTIBODY (ROUTINE TESTING W REFLEX): HIV Screen 4th Generation wRfx: NONREACTIVE

## 2019-05-05 LAB — GLUCOSE TOLERANCE, 2 HOURS W/ 1HR
Glucose, 1 hour: 67 mg/dL (ref 65–179)
Glucose, 2 hour: 103 mg/dL (ref 65–152)
Glucose, Fasting: 85 mg/dL (ref 65–91)

## 2019-05-18 ENCOUNTER — Encounter: Payer: Self-pay | Admitting: Obstetrics

## 2019-05-18 ENCOUNTER — Telehealth (INDEPENDENT_AMBULATORY_CARE_PROVIDER_SITE_OTHER): Payer: Medicaid Other | Admitting: Obstetrics

## 2019-05-18 DIAGNOSIS — Z3A29 29 weeks gestation of pregnancy: Secondary | ICD-10-CM

## 2019-05-18 DIAGNOSIS — Z3490 Encounter for supervision of normal pregnancy, unspecified, unspecified trimester: Secondary | ICD-10-CM

## 2019-05-18 DIAGNOSIS — Z3493 Encounter for supervision of normal pregnancy, unspecified, third trimester: Secondary | ICD-10-CM

## 2019-05-18 NOTE — Progress Notes (Signed)
   TELEHEALTH OBSTETRICS PRENATAL VIRTUAL VIDEO VISIT ENCOUNTER NOTE  Provider location: Center for [employer] [facility] at [place]   I connected with [name] on 05/18/19 at  2:00 PM EDT by WebEx OB MyChart Video Encounter at home and verified that I am speaking with the correct person using two identifiers.   I discussed the limitations, risks, security and privacy concerns of performing an evaluation and management service virtually and the availability of in person appointments. I also discussed with the patient that there may be a patient responsible charge related to this service. The patient expressed understanding and agreed to proceed. Subjective:  Tonya Martin is a 23 y.o. G1P0 at [redacted]w[redacted]d being seen today for ongoing prenatal care.  She is currently monitored for the following issues for this low-risk pregnancy and has Adjustment disorder with mixed anxiety and depressed mood; Dysmenorrhea; Food allergy; Other seasonal allergic rhinitis; Vaginal discharge; and Supervision of normal pregnancy, antepartum on their problem list.  Patient reports heartburn.  Contractions: Not present. Vag. Bleeding: None.  Movement: Present. Denies any leaking of fluid.   The following portions of the patient's history were reviewed and updated as appropriate: allergies, current medications, past family history, past medical history, past social history, past surgical history and problem list.   Objective:   Vitals:   05/18/19 1346  BP: 137/89  Pulse: 77    Fetal Status:     Movement: Present     General:  Alert, oriented and cooperative. Patient is in no acute distress.  Respiratory: Normal respiratory effort, no problems with respiration noted  Mental Status: Normal mood and affect. Normal behavior. Normal judgment and thought content.  Rest of physical exam deferred due to type of encounter  Imaging: No results found.  Assessment and Plan:  Pregnancy: G1P0 at [redacted]w[redacted]d 1. Encounter for  supervision of normal pregnancy, antepartum, unspecified gravidity   Preterm labor symptoms and general obstetric precautions including but not limited to vaginal bleeding, contractions, leaking of fluid and fetal movement were reviewed in detail with the patient. I discussed the assessment and treatment plan with the patient. The patient was provided an opportunity to ask questions and all were answered. The patient agreed with the plan and demonstrated an understanding of the instructions. The patient was advised to call back or seek an in-person office evaluation/go to MAU at [facility] for any urgent or concerning symptoms. Please refer to After Visit Summary for other counseling recommendations.   I provided 10 minutes of face-to-face time during this encounter.  Return in about 2 weeks (around 06/01/2019) for MyChart.  Future Appointments  Date Time Provider [place]  05/25/2019 11:00 AM [place] MFC-US  05/25/2019 11:00 AM WH-MFC [place] 3 WH-MFCUS MFC-US    Baltazar Najjar, [place] for [facility], [place] [facility] 05-18-2019

## 2019-05-18 NOTE — Progress Notes (Signed)
I connected with Tonya Martin on 05/18/19 at  2:00 PM EDT by telephone and verified that I am speaking with the correct person using two identifiers.   No concerns today per pt

## 2019-05-20 ENCOUNTER — Other Ambulatory Visit (HOSPITAL_COMMUNITY): Payer: Self-pay | Admitting: *Deleted

## 2019-05-20 DIAGNOSIS — O35EXX0 Maternal care for other (suspected) fetal abnormality and damage, fetal genitourinary anomalies, not applicable or unspecified: Secondary | ICD-10-CM

## 2019-05-20 DIAGNOSIS — O358XX0 Maternal care for other (suspected) fetal abnormality and damage, not applicable or unspecified: Secondary | ICD-10-CM

## 2019-05-25 ENCOUNTER — Other Ambulatory Visit: Payer: Self-pay

## 2019-05-25 ENCOUNTER — Encounter (HOSPITAL_COMMUNITY): Payer: Self-pay

## 2019-05-25 ENCOUNTER — Ambulatory Visit (HOSPITAL_COMMUNITY)
Admission: RE | Admit: 2019-05-25 | Discharge: 2019-05-25 | Disposition: A | Discharge status: Home / Self Care | Payer: Medicaid Other | Source: Ambulatory Visit | Attending: Obstetrics and Gynecology | Admitting: Obstetrics and Gynecology

## 2019-05-25 ENCOUNTER — Ambulatory Visit (HOSPITAL_COMMUNITY): Payer: Medicaid Other | Admitting: *Deleted

## 2019-05-25 ENCOUNTER — Other Ambulatory Visit (HOSPITAL_COMMUNITY): Payer: Self-pay | Admitting: Maternal & Fetal Medicine

## 2019-05-25 ENCOUNTER — Other Ambulatory Visit (HOSPITAL_COMMUNITY): Payer: Self-pay | Admitting: *Deleted

## 2019-05-25 VITALS — BP 128/72 | HR 79 | Temp 98.3°F

## 2019-05-25 DIAGNOSIS — Z3400 Encounter for supervision of normal first pregnancy, unspecified trimester: Secondary | ICD-10-CM | POA: Diagnosis present

## 2019-05-25 DIAGNOSIS — O359XX0 Maternal care for (suspected) fetal abnormality and damage, unspecified, not applicable or unspecified: Secondary | ICD-10-CM | POA: Diagnosis present

## 2019-05-25 DIAGNOSIS — O365930 Maternal care for other known or suspected poor fetal growth, third trimester, not applicable or unspecified: Secondary | ICD-10-CM | POA: Diagnosis not present

## 2019-05-25 DIAGNOSIS — O35EXX0 Maternal care for other (suspected) fetal abnormality and damage, fetal genitourinary anomalies, not applicable or unspecified: Secondary | ICD-10-CM

## 2019-05-25 DIAGNOSIS — O358XX0 Maternal care for other (suspected) fetal abnormality and damage, not applicable or unspecified: Secondary | ICD-10-CM

## 2019-05-25 DIAGNOSIS — Z3A30 30 weeks gestation of pregnancy: Secondary | ICD-10-CM

## 2019-05-25 DIAGNOSIS — Z362 Encounter for other antenatal screening follow-up: Secondary | ICD-10-CM | POA: Diagnosis not present

## 2019-05-25 DIAGNOSIS — O365990 Maternal care for other known or suspected poor fetal growth, unspecified trimester, not applicable or unspecified: Secondary | ICD-10-CM | POA: Insufficient documentation

## 2019-06-01 ENCOUNTER — Telehealth (INDEPENDENT_AMBULATORY_CARE_PROVIDER_SITE_OTHER): Payer: Medicaid Other | Admitting: Obstetrics

## 2019-06-01 ENCOUNTER — Encounter: Payer: Self-pay | Admitting: Obstetrics

## 2019-06-01 DIAGNOSIS — Z3A31 31 weeks gestation of pregnancy: Secondary | ICD-10-CM

## 2019-06-01 DIAGNOSIS — O0993 Supervision of high risk pregnancy, unspecified, third trimester: Secondary | ICD-10-CM

## 2019-06-01 DIAGNOSIS — O0990 Supervision of high risk pregnancy, unspecified, unspecified trimester: Secondary | ICD-10-CM

## 2019-06-01 DIAGNOSIS — O365931 Maternal care for other known or suspected poor fetal growth, third trimester, fetus 1: Secondary | ICD-10-CM

## 2019-06-01 NOTE — Progress Notes (Signed)
I connected with Tonya Martin on 06/01/19 at  2:15 PM EDT by telephone and verified that I am speaking with the correct person using two identifiers.  Webex ROB: Does not have cuff with cuff today. Moving to [place] after delivery  No concerns today per pt

## 2019-06-01 NOTE — Progress Notes (Signed)
TELEHEALTH OBSTETRICS PRENATAL VIRTUAL VIDEO VISIT ENCOUNTER NOTE  Provider location: Center for [employer] [facility] at [place]   I connected with Tonya Martin on 06/01/19 at  2:15 PM EDT by WebEx OB MyChart Video Encounter at home and verified that I am speaking with the correct person using two identifiers.   I discussed the limitations, risks, security and privacy concerns of performing an evaluation and management service virtually and the availability of in person appointments. I also discussed with the patient that there may be a patient responsible charge related to this service. The patient expressed understanding and agreed to proceed. Subjective:  Tonya Martin is a 23 y.o. G1P0 at [redacted]w[redacted]d being seen today for ongoing prenatal care.  She is currently monitored for the following issues for this high-risk pregnancy and has Adjustment disorder with mixed anxiety and depressed mood; Dysmenorrhea; Food allergy; Other seasonal allergic rhinitis; Vaginal discharge; Supervision of normal pregnancy, antepartum; and Pregnancy affected by fetal growth restriction on their problem list.  Patient reports heartburn.  Contractions: Not present. Vag. Bleeding: None.  Movement: Present. Denies any leaking of fluid.   The following portions of the patient's history were reviewed and updated as appropriate: allergies, current medications, past family history, past medical history, past social history, past surgical history and problem list.   Objective:  There were no vitals filed for this visit.  Fetal Status:     Movement: Present     General:  Alert, oriented and cooperative. Patient is in no acute distress.  Respiratory: Normal respiratory effort, no problems with respiration noted  Mental Status: Normal mood and affect. Normal behavior. Normal judgment and thought content.  Rest of physical exam deferred due to type of encounter  Imaging: [place] Mfm Ob Follow Up  Result Date:  05/25/2019 ----------------------------------------------------------------------  OBSTETRICS REPORT                       (Signed Final 05/25/2019 01:21 pm) ---------------------------------------------------------------------- Patient Info  ID #:       [id number]                          D.O.B.:  1996-01-08 (23 yrs)  Name:       Tonya Martin                     Visit Date: 05/25/2019 11:14 am ---------------------------------------------------------------------- Performed By  Performed By:     Hurman Horn          Ref. Address:     [address]                    RDMS                                                             Rd                                                             Jacky Kindle  Attending:        Noralee Space MD  [facility]  Referred By:      Wilmer Floor LEFTWICH-                    KIRBY CNM ---------------------------------------------------------------------- Orders   #  Description                          Code         Ordered By   1  [place] MFM OB FOLLOW UP                  46962.95     Lin Landsman   2  [place] MFM UA CORD DOPPLER               28413.24     Lin Landsman  ----------------------------------------------------------------------   #  Order #                    Accession #                 Episode #   1  [id number]                  [id number]                  [id number]   2  [id number]                  [id number]                  [id number]  ---------------------------------------------------------------------- Indications   Fetal abnormality - other known or             O35.9XX0   suspected (EIF, pyelectasis)   Encounter for other antenatal screening        Z36.2   follow-up   Small for gestational age fetus affecting      O47.5990   management of mother   [redacted] weeks  gestation of pregnancy                Z3A.30  ---------------------------------------------------------------------- Vital Signs                                                 Height:  5'5" ---------------------------------------------------------------------- Fetal Evaluation  Num Of Fetuses:         1  Fetal Heart Rate(bpm):  144  Cardiac Activity:       Observed  Presentation:           Breech  Placenta:               Anterior  P. Cord Insertion:      Visualized  Amniotic Fluid  AFI FV:      Within normal limits  AFI Sum(cm)     %Tile       Largest Pocket(cm)  20.62           80          6.03  RUQ(cm)       RLQ(cm)       LUQ(cm)        LLQ(cm)  4.44          6.03          5.35           4.8 ---------------------------------------------------------------------- Biometry  BPD:      78.1  mm     G. Age:  31w 2d         54  %    CI:        76.31   %    70 - 86                                                          FL/HC:      19.0   %    19.3 - 21.3  HC:      283.3  mm     G. Age:  31w 1d         20  %    HC/AC:      1.12        0.96 - 1.17  AC:      252.4  mm     G. Age:  29w 3d         11  %    FL/BPD:     68.8   %    71 - 87  FL:       53.7  mm     G. Age:  28w 3d          1  %    FL/AC:      21.3   %    20 - 24  HUM:      49.3  mm     G. Age:  29w 0d         12  %  Est. FW:    1388  gm      3 lb 1 oz      6  % ---------------------------------------------------------------------- OB History  Gravidity:    1  Living:       0 ---------------------------------------------------------------------- Gestational Age  LMP:           30w 6d        Date:  10/21/18                 EDD:   07/28/19  U/S Today:     30w 1d  EDD:   08/02/19  Best:          30w 6d     Det. By:  LMP  (10/21/18)          EDD:   07/28/19 ---------------------------------------------------------------------- Anatomy  Cranium:               Appears normal         Aortic Arch:            Previously seen   Cavum:                 Appears normal         Ductal Arch:            Previously seen  Ventricles:            Appears normal         Diaphragm:              Appears normal  Choroid Plexus:        Previously seen        Stomach:                Appears normal, left                                                                        sided  Cerebellum:            Previously seen        Abdomen:                Appears normal  Posterior Fossa:       Previously seen        Abdominal Wall:         Previously seen  Nuchal Fold:           Previously seen        Cord Vessels:           Previously seen  Face:                  Appears normal         Kidneys:                Appear normal                         (orbits and profile)  Lips:                  Appears normal         Bladder:                Appears normal  Thoracic:              Appears normal         Spine:                  Previously seen  Heart:                 Bilateral              Upper Extremities:      Previously seen  echogenic focus                         prev se  RVOT:                  Appears normal         Lower Extremities:      Previously seen  LVOT:                  Appears normal  Other:  Female gender. Nasal bone previously visualized. 5th digit visualized.          Technically difficult due to fetal position. ---------------------------------------------------------------------- Doppler - Fetal Vessels  Umbilical Artery   S/D     %tile     RI              PI                     ADFV    RDFV  2.33       23   0.57             0.82                        No      No ---------------------------------------------------------------------- Impression  Patient returned for fetal growth and renal assessments.  Bilateral urinary tract dilations (UTD) were seen on previous  ultrasound.  On ultrasound, the estimated fetal weight is at the 6th  percentile. Femur length measures at less than the 5th  percentile. Amniotic fluid is normal and  good fetal activity is  seen. Umbilical artery Doppler, performed because of  suspected fetal growth restriction, showed normal forward  diastolic flow.  I explained the findings consistent with fetal growth  restriction. I also informed her that because it is difficult to  differentiate between growth restriction and a constitutionally  small fetus, we recommend close fetal surveillance till  delivery. ---------------------------------------------------------------------- Recommendations  -Weekly UA Doppler.  -Fetal growth assessment in 3 weeks. ----------------------------------------------------------------------                  Noralee Spaceavi Shankar, MD Electronically Signed Final Report   05/25/2019 01:21 pm ----------------------------------------------------------------------  [place] Mfm Ua Cord Doppler  Result Date: 05/25/2019 ----------------------------------------------------------------------  OBSTETRICS REPORT                       (Signed Final 05/25/2019 01:21 pm) ---------------------------------------------------------------------- Patient Info  ID #:       [id number]                          D.O.B.:  1996/02/02 (23 yrs)  Name:       Tonya Martin                     Visit Date: 05/25/2019 11:14 am ---------------------------------------------------------------------- Performed By  Performed By:     Hurman HornAlyssa Keatts          Ref. Address:     [address] [place]                    RDMS  R[address]                                                             Jacky Kindle  Attending:        Noralee Space MD        [facility]  Referred By:      Hurshel Party CNM ---------------------------------------------------------------------- Orders   #  Description                          Code         Ordered By   1  [place] MFM OB FOLLOW UP                   76816.01     Lin Landsman   2  [place] MFM UA CORD DOPPLER               40981.19     Lin Landsman  ----------------------------------------------------------------------   #  Order #                    Accession #                 Episode #   1  [id number]                  [id number]                  [id number]   2  [id number]                  [id number]                  [id number]  ---------------------------------------------------------------------- Indications   Fetal abnormality - other known or             O35.9XX0   suspected (EIF, pyelectasis)   Encounter for other antenatal screening        Z36.2   follow-up   Small for gestational age fetus affecting      O36.5990   management of mother  [redacted] weeks gestation of pregnancy                Z3A.30  ---------------------------------------------------------------------- Vital Signs                                                 Height:        5'5" ---------------------------------------------------------------------- Fetal Evaluation  Num Of Fetuses:         1  Fetal Heart Rate(bpm):  144  Cardiac Activity:       Observed  Presentation:           Breech  Placenta:               Anterior  P. Cord Insertion:      Visualized  Amniotic Fluid  AFI FV:      Within normal limits  AFI Sum(cm)     %Tile       Largest Pocket(cm)  20.62           80          6.03  RUQ(cm)       RLQ(cm)       LUQ(cm)        LLQ(cm)  4.44          6.03          5.35           4.8 ---------------------------------------------------------------------- Biometry  BPD:      78.1  mm     G. Age:  31w 2d         54  %    CI:        76.31   %    70 - 86                                                          FL/HC:      19.0   %    19.3 - 21.3  HC:      283.3  mm     G. Age:  31w 1d         20  %    HC/AC:      1.12        0.96 - 1.17  AC:      252.4  mm     G. Age:  29w 3d         11  %     FL/BPD:     68.8   %    71 - 87  FL:       53.7  mm     G. Age:  28w 3d          1  %    FL/AC:      21.3   %    20 - 24  HUM:      49.3  mm     G. Age:  29w 0d         12  %  Est. FW:    1388  gm      3 lb 1 oz      6  % ---------------------------------------------------------------------- OB History  Gravidity:    1  Living:       0 ---------------------------------------------------------------------- Gestational Age  LMP:           30w 6d        Date:  10/21/18                 EDD:   07/28/19  U/S Today:     30w 1d                                        EDD:   08/02/19  Best:          30w 6d     Det. By:  LMP  (10/21/18)          EDD:   07/28/19 ---------------------------------------------------------------------- Anatomy  Cranium:               Appears normal         Aortic Arch:            Previously seen  Cavum:                 Appears normal         Ductal Arch:            Previously seen  Ventricles:            Appears normal         Diaphragm:              Appears normal  Choroid Plexus:        Previously seen        Stomach:                Appears normal, left                                                                        sided  Cerebellum:            Previously seen        Abdomen:                Appears normal  Posterior Fossa:       Previously seen        Abdominal Wall:         Previously seen  Nuchal Fold:           Previously seen        Cord Vessels:           Previously seen  Face:                  Appears normal         Kidneys:                Appear normal                         (orbits and profile)  Lips:                  Appears normal         Bladder:  Appears normal  Thoracic:              Appears normal         Spine:                  Previously seen  Heart:                 Bilateral              Upper Extremities:      Previously seen                         echogenic focus                         prev se  RVOT:                  Appears normal         Lower  Extremities:      Previously seen  LVOT:                  Appears normal  Other:  Female gender. Nasal bone previously visualized. 5th digit visualized.          Technically difficult due to fetal position. ---------------------------------------------------------------------- Doppler - Fetal Vessels  Umbilical Artery   S/D     %tile     RI              PI                     ADFV    RDFV  2.33       23   0.57             0.82                        No      No ---------------------------------------------------------------------- Impression  Patient returned for fetal growth and renal assessments.  Bilateral urinary tract dilations (UTD) were seen on previous  ultrasound.  On ultrasound, the estimated fetal weight is at the 6th  percentile. Femur length measures at less than the 5th  percentile. Amniotic fluid is normal and good fetal activity is  seen. Umbilical artery Doppler, performed because of  suspected fetal growth restriction, showed normal forward  diastolic flow.  I explained the findings consistent with fetal growth  restriction. I also informed her that because it is difficult to  differentiate between growth restriction and a constitutionally  small fetus, we recommend close fetal surveillance till  delivery. ---------------------------------------------------------------------- Recommendations  -Weekly UA Doppler.  -Fetal growth assessment in 3 weeks. ----------------------------------------------------------------------                  Noralee Spaceavi Shankar, MD Electronically Signed Final Report   05/25/2019 01:21 pm ----------------------------------------------------------------------   Assessment and Plan:  Pregnancy: G1P0 at 10129w6d 1. Supervision of high risk pregnancy, antepartum  2. IUGR (intrauterine growth restriction) affecting care of mother, third trimester, fetus 1 ( EFW at 6th % tile ) - weekly BPP with Dopplers - interval growth every 3 weeks  Preterm labor symptoms and general  obstetric precautions including but not limited to vaginal bleeding, contractions, leaking of fluid and fetal movement were reviewed in detail with the patient. I discussed the assessment and treatment plan with the patient. The patient was provided an opportunity to ask questions and all  were answered. The patient agreed with the plan and demonstrated an understanding of the instructions. The patient was advised to call back or seek an in-person office evaluation/go to [facility]U at [facility] for any urgent or concerning symptoms. Please refer to After Visit Summary for other counseling recommendations.   I provided 10 minutes of face-to-face time during this encounter.  Return in about 2 weeks (around 06/15/2019) for MyChart.  Future Appointments  Date Time Provider Department Center  06/02/2019 12:45 PM WH-MFC NURSE WH-MFC MFC-US  06/02/2019 12:45 PM WH-MFC [place] 2 WH-MFCUS MFC-US  06/08/2019 12:30 PM WH-MFC NURSE WH-MFC MFC-US  06/08/2019 12:30 PM WH-MFC [place] 1 WH-MFCUS MFC-US  06/16/2019 10:30 AM WH-MFC NURSE WH-MFC MFC-US  06/16/2019 10:30 AM WH-MFC [place] 1 WH-MFCUS MFC-US  06/22/2019 11:00 AM WH-MFC NURSE WH-MFC MFC-US  06/22/2019 11:00 AM WH-MFC [place] 3 WH-MFCUS MFC-US    Coral Ceo, MD Center for [facility], [facility] 06/01/2019

## 2019-06-02 ENCOUNTER — Ambulatory Visit (HOSPITAL_COMMUNITY)
Admission: RE | Admit: 2019-06-02 | Discharge: 2019-06-02 | Disposition: A | Discharge status: Home / Self Care | Payer: Medicaid Other | Source: Ambulatory Visit | Attending: Obstetrics and Gynecology | Admitting: Obstetrics and Gynecology

## 2019-06-02 ENCOUNTER — Ambulatory Visit (HOSPITAL_COMMUNITY): Payer: Medicaid Other | Admitting: *Deleted

## 2019-06-02 ENCOUNTER — Other Ambulatory Visit: Payer: Self-pay

## 2019-06-02 ENCOUNTER — Encounter (HOSPITAL_COMMUNITY): Payer: Self-pay

## 2019-06-02 VITALS — BP 129/74 | HR 83 | Temp 98.5°F

## 2019-06-02 DIAGNOSIS — O283 Abnormal ultrasonic finding on antenatal screening of mother: Secondary | ICD-10-CM | POA: Diagnosis present

## 2019-06-02 DIAGNOSIS — Z3A32 32 weeks gestation of pregnancy: Secondary | ICD-10-CM

## 2019-06-02 DIAGNOSIS — O359XX0 Maternal care for (suspected) fetal abnormality and damage, unspecified, not applicable or unspecified: Secondary | ICD-10-CM | POA: Diagnosis not present

## 2019-06-02 DIAGNOSIS — O365930 Maternal care for other known or suspected poor fetal growth, third trimester, not applicable or unspecified: Secondary | ICD-10-CM

## 2019-06-08 ENCOUNTER — Encounter (HOSPITAL_COMMUNITY): Payer: Self-pay

## 2019-06-08 ENCOUNTER — Ambulatory Visit (HOSPITAL_COMMUNITY): Payer: Medicaid Other | Admitting: *Deleted

## 2019-06-08 ENCOUNTER — Ambulatory Visit (HOSPITAL_COMMUNITY)
Admission: RE | Admit: 2019-06-08 | Discharge: 2019-06-08 | Disposition: A | Discharge status: Home / Self Care | Payer: Medicaid Other | Source: Ambulatory Visit | Attending: Obstetrics and Gynecology | Admitting: Obstetrics and Gynecology

## 2019-06-08 ENCOUNTER — Other Ambulatory Visit: Payer: Self-pay

## 2019-06-08 DIAGNOSIS — Z3400 Encounter for supervision of normal first pregnancy, unspecified trimester: Secondary | ICD-10-CM | POA: Insufficient documentation

## 2019-06-08 DIAGNOSIS — Z3A32 32 weeks gestation of pregnancy: Secondary | ICD-10-CM | POA: Diagnosis not present

## 2019-06-08 DIAGNOSIS — O359XX0 Maternal care for (suspected) fetal abnormality and damage, unspecified, not applicable or unspecified: Secondary | ICD-10-CM | POA: Diagnosis not present

## 2019-06-08 DIAGNOSIS — O365930 Maternal care for other known or suspected poor fetal growth, third trimester, not applicable or unspecified: Secondary | ICD-10-CM | POA: Diagnosis not present

## 2019-06-15 ENCOUNTER — Telehealth (INDEPENDENT_AMBULATORY_CARE_PROVIDER_SITE_OTHER): Payer: Medicaid Other | Admitting: Obstetrics

## 2019-06-15 ENCOUNTER — Encounter: Payer: Self-pay | Admitting: Obstetrics

## 2019-06-15 DIAGNOSIS — O365931 Maternal care for other known or suspected poor fetal growth, third trimester, fetus 1: Secondary | ICD-10-CM

## 2019-06-15 DIAGNOSIS — K219 Gastro-esophageal reflux disease without esophagitis: Secondary | ICD-10-CM

## 2019-06-15 DIAGNOSIS — M549 Dorsalgia, unspecified: Secondary | ICD-10-CM

## 2019-06-15 DIAGNOSIS — O365930 Maternal care for other known or suspected poor fetal growth, third trimester, not applicable or unspecified: Secondary | ICD-10-CM

## 2019-06-15 DIAGNOSIS — Z3A33 33 weeks gestation of pregnancy: Secondary | ICD-10-CM

## 2019-06-15 DIAGNOSIS — O0993 Supervision of high risk pregnancy, unspecified, third trimester: Secondary | ICD-10-CM

## 2019-06-15 DIAGNOSIS — O0990 Supervision of high risk pregnancy, unspecified, unspecified trimester: Secondary | ICD-10-CM

## 2019-06-15 MED ORDER — COMFORT FIT MATERNITY SUPP SM MISC: 0 refills | Status: DC

## 2019-06-15 MED ORDER — OMEPRAZOLE 20 MG PO CPDR: 20.0000 mg | DELAYED_RELEASE_CAPSULE | Freq: Two times a day (BID) | ORAL | 5 refills | Status: DC

## 2019-06-15 NOTE — Progress Notes (Signed)
TELEHEALTH OBSTETRICS PRENATAL VIRTUAL VIDEO VISIT ENCOUNTER NOTE  Provider location: [facility] [place]   I connected with Tonya Martin on 06/15/19 at  3:30 PM EDT by OB MyChart Video Encounter at home and verified that I am speaking with the correct person using two identifiers.   I discussed the limitations, risks, security and privacy concerns of performing an evaluation and management service virtually and the availability of in person appointments. I also discussed with the patient that there may be a patient responsible charge related to this service. The patient expressed understanding and agreed to proceed.  Subjective:  Tonya Martin is a 23 y.o. G1P0 at [redacted]w[redacted]d being seen today for ongoing prenatal care.  She is currently monitored for the following issues for this high-risk pregnancy and has Adjustment disorder with mixed anxiety and depressed mood; Dysmenorrhea; Food allergy; Other seasonal allergic rhinitis; Vaginal discharge; Supervision of normal pregnancy, antepartum; and Pregnancy affected by fetal growth restriction on their problem list.  Patient reports backache.  Contractions: Not present. Vag. Bleeding: None.  Movement: Present. Denies any leaking of fluid.   The following portions of the patient's history were reviewed and updated as appropriate: allergies, current medications, past family history, past medical history, past social history, past surgical history and problem list.   Objective:  There were no vitals filed for this visit.  Fetal Status:     Movement: Present     General:  Alert, oriented and cooperative. Patient is in no acute distress.  Respiratory: Normal respiratory effort, no problems with respiration noted  Mental Status: Normal mood and affect. Normal behavior. Normal judgment and thought content.  Rest of physical exam deferred due to type of encounter  Imaging: [place] Mfm Fetal Bpp Wo Non Stress  Result Date:  06/08/2019 ----------------------------------------------------------------------  OBSTETRICS REPORT                       (Signed Final 06/08/2019 01:34 pm) ---------------------------------------------------------------------- Patient Info  ID #:       [id number]                          D.O.B.:  August 03, 1996 (23 yrs)  Name:       Tonya Martin                     Visit Date: 06/08/2019 12:56 pm ---------------------------------------------------------------------- Performed By  Performed By:     Percell Boston          Ref. Address:     [address]                    RDMS                                                             Rd                                                             Jacky Kindle  Attending:        Bettey Costa  Booker      [facility]                    MD                                       [facility]etal [facility]  Referred By:      Wilmer Floor LEFTWICH-                    KIRBY CNM ---------------------------------------------------------------------- Orders   #  Description                          Code         Ordered By   1  [place] MFM FETAL BPP WO NON              76819.01     RAVI SHANKAR      STRESS   2  [place] MFM UA CORD DOPPLER               04540.98     RAVI [facility]  ----------------------------------------------------------------------   #  Order #                    Accession #                 Episode #   1  [id number]                  [id number]                  [id number]   2  [id number]                  [id number]                  [id number]  ---------------------------------------------------------------------- Indications   [redacted] weeks gestation of pregnancy                Z3A.32   Fetal abnormality - other known or             O35.9XX0   suspected (EIF, pyelectasis)   Encounter for other antenatal screening        Z36.2   follow-up   Small for gestational age fetus affecting      O20.5990   management of mother   ---------------------------------------------------------------------- Vital Signs                                                 Height:        5'5" ---------------------------------------------------------------------- Fetal Evaluation  Num Of Fetuses:         1  Fetal Heart Rate(bpm):  135  Cardiac Activity:       Observed  Presentation:           Cephalic  Placenta:               Anterior  Amniotic Fluid  AFI FV:      Within normal limits  AFI Sum(cm)     %Tile       Largest Pocket(cm)  14.24  49          4.69  RUQ(cm)       RLQ(cm)       LUQ(cm)        LLQ(cm)  3.25          3.68          2.62           4.69 ---------------------------------------------------------------------- Biophysical Evaluation  Amniotic F.V:   Within normal limits       F. Tone:        Observed  F. Movement:    Observed                   Score:          8/8  F. Breathing:   Observed ---------------------------------------------------------------------- OB History  Gravidity:    1  Living:       0 ---------------------------------------------------------------------- Gestational Age  LMP:           32w 6d        Date:  10/21/18                 EDD:   07/28/19  Best:          Armida Sans 6d     Det. By:  LMP  (10/21/18)          EDD:   07/28/19 ---------------------------------------------------------------------- Anatomy  Thoracic:              Appears normal         Bladder:                Appears normal  Stomach:               Appears normal, left                         sided ---------------------------------------------------------------------- Doppler - Fetal Vessels  Umbilical Artery   S/D     %tile     RI              PI                     ADFV    RDFV  2.15       20   0.54             0.76                         N       N ---------------------------------------------------------------------- Cervix Uterus Adnexa  Cervix  Not visualized (advanced GA >24wks) ----------------------------------------------------------------------  Impression  Known IUGR  Biophysical profile 8/8  Normal UA Dopplers ---------------------------------------------------------------------- Recommendations  Continue weekly testing with UA Dopplers  Repeat growth in 3 weeks. ----------------------------------------------------------------------               Lin Landsman, MD Electronically Signed Final Report   06/08/2019 01:34 pm ----------------------------------------------------------------------  [place] Mfm Ob Follow Up  Result Date: 05/25/2019 ----------------------------------------------------------------------  OBSTETRICS REPORT                       (Signed Final 05/25/2019 01:21 pm) ---------------------------------------------------------------------- Patient Info  ID #:       [id number]                          D.O.B.:  Jan 28, 1996 (23 yrs)  Name:  Tonya Martin                     Visit Date: 05/25/2019 11:14 am ---------------------------------------------------------------------- Performed By  Performed By:     Hurman Horn          Ref. Address:     [address]                    RDMS                                                             Rd                                                             Jacky Kindle  Attending:        Noralee Space MD        [facility]                                                             [facility]etal [facility]  Referred By:      Wilmer Floor LEFTWICH-                    Craige Cotta CNM ---------------------------------------------------------------------- Orders   #  Description                          Code         Ordered By   1  [place] MFM OB FOLLOW UP                  76816.01     Lin Landsman   2  [place] MFM UA CORD DOPPLER               16109.60     Lin Landsman  ----------------------------------------------------------------------   #  Order #                    Accession #                  Episode #   1  [id number]  [id number]                  [id number]   2  [id number]                  [id number]                  [id number]  ---------------------------------------------------------------------- Indications   Fetal abnormality - other known or             O35.9XX0   suspected (EIF, pyelectasis)   Encounter for other antenatal screening        Z36.2   follow-up   Small for gestational age fetus affecting      O71.5990   management of mother   [redacted] weeks gestation of pregnancy                Z3A.30  ---------------------------------------------------------------------- Vital Signs                                                 Height:        5'5" ---------------------------------------------------------------------- Fetal Evaluation  Num Of Fetuses:         1  Fetal Heart Rate(bpm):  144  Cardiac Activity:       Observed  Presentation:           Breech  Placenta:               Anterior  P. Cord Insertion:      Visualized  Amniotic Fluid  AFI FV:      Within normal limits  AFI Sum(cm)     %Tile       Largest Pocket(cm)  20.62           80          6.03  RUQ(cm)       RLQ(cm)       LUQ(cm)        LLQ(cm)  4.44          6.03          5.35           4.8 ---------------------------------------------------------------------- Biometry  BPD:      78.1  mm     G. Age:  31w 2d         54  %    CI:        76.31   %    70 - 86                                                          FL/HC:      19.0   %    19.3 - 21.3  HC:      283.3  mm     G. Age:  31w 1d         20  %    HC/AC:      1.12        0.96 - 1.17  AC:      252.4  mm     G. Age:  29w 3d         11  %  FL/BPD:     68.8   %    71 - 87  FL:       53.7  mm     G. Age:  28w 3d          1  %    FL/AC:      21.3   %    20 - 24  HUM:      49.3  mm     G. Age:  29w 0d         12  %  Est. FW:    1388  gm      3 lb 1 oz      6  % ---------------------------------------------------------------------- OB History  Gravidity:    1  Living:        0 ---------------------------------------------------------------------- Gestational Age  LMP:           30w 6d        Date:  10/21/18                 EDD:   07/28/19  U/S Today:     30w 1d                                        EDD:   08/02/19  Best:          30w 6d     Det. By:  LMP  (10/21/18)          EDD:   07/28/19 ---------------------------------------------------------------------- Anatomy  Cranium:               Appears normal         Aortic Arch:            Previously seen  Cavum:                 Appears normal         Ductal Arch:            Previously seen  Ventricles:            Appears normal         Diaphragm:              Appears normal  Choroid Plexus:        Previously seen        Stomach:                Appears normal, left                                                                        sided  Cerebellum:            Previously seen        Abdomen:                Appears normal  Posterior Fossa:       Previously seen        Abdominal Wall:         Previously seen  Nuchal Fold:           Previously seen  Cord Vessels:           Previously seen  Face:                  Appears normal         Kidneys:                Appear normal                         (orbits and profile)  Lips:                  Appears normal         Bladder:                Appears normal  Thoracic:              Appears normal         Spine:                  Previously seen  Heart:                 Bilateral              Upper Extremities:      Previously seen                         echogenic focus                         prev se  RVOT:                  Appears normal         Lower Extremities:      Previously seen  LVOT:                  Appears normal  Other:  Female gender. Nasal bone previously visualized. 5th digit visualized.          Technically difficult due to fetal position. ---------------------------------------------------------------------- Doppler - Fetal Vessels  Umbilical Artery   S/D     %tile     RI               PI                     ADFV    RDFV  2.33       23   0.57             0.82                        No      No ---------------------------------------------------------------------- Impression  Patient returned for fetal growth and renal assessments.  Bilateral urinary tract dilations (UTD) were seen on previous  ultrasound.  On ultrasound, the estimated fetal weight is at the 6th  percentile. Femur length measures at less than the 5th  percentile. Amniotic fluid is normal and good fetal activity is  seen. Umbilical artery Doppler, performed because of  suspected fetal growth restriction, showed normal forward  diastolic flow.  I explained the findings consistent with fetal growth  restriction. I also informed her that because it is difficult to  differentiate between growth restriction and a constitutionally  small fetus, we recommend close fetal surveillance till  delivery. ---------------------------------------------------------------------- Recommendations  -Weekly UA Doppler.  -Fetal growth assessment in  3 weeks. ----------------------------------------------------------------------                  Noralee Space, MD Electronically Signed Final Report   05/25/2019 01:21 pm ----------------------------------------------------------------------  [place] Mfm Ua Cord Doppler  Result Date: 06/08/2019 ----------------------------------------------------------------------  OBSTETRICS REPORT                       (Signed Final 06/08/2019 01:34 pm) ---------------------------------------------------------------------- Patient Info  ID #:       [id number]                          D.O.B.:  [birth year] (23 yrs)  Name:       Tonya Martin                     Visit Date: 06/08/2019 12:56 pm ---------------------------------------------------------------------- Performed By  Performed By:     Percell Boston          Ref. Address:     [address]                    RDMS                                                              Rd                                                             Jacky Kindle  Attending:        Lin Landsman      [facility]                    MD                                       Fetal Care  Referred By:      Wilmer Floor LEFTWICH-                    KIRBY CNM ---------------------------------------------------------------------- Orders   #  Description                          Code         Ordered By   1  [place] MFM FETAL BPP WO NON              76819.01     RAVI SHANKAR      STRESS   2  [place] MFM UA CORD DOPPLER               82956.21     RAVI SHANKAR  ----------------------------------------------------------------------   #  Order #                    Accession #                 Episode #  1  [id number]                  [id number]   2  [id number]                  [id number]                  [id number]  ---------------------------------------------------------------------- Indications   [redacted] weeks gestation of pregnancy                Z3A.32   Fetal abnormality - other known or             O35.9XX0   suspected (EIF, pyelectasis)   Encounter for other antenatal screening        Z36.2   follow-up   Small for gestational age fetus affecting      O62.5990   management of mother  ---------------------------------------------------------------------- Vital Signs                                                 Height:        5'5" ---------------------------------------------------------------------- Fetal Evaluation  Num Of Fetuses:         1  Fetal Heart Rate(bpm):  135  Cardiac Activity:       Observed  Presentation:           Cephalic  Placenta:               Anterior  Amniotic Fluid  AFI FV:      Within normal limits  AFI Sum(cm)     %Tile       Largest Pocket(cm)  14.24           49          4.69  RUQ(cm)       RLQ(cm)       LUQ(cm)        LLQ(cm)  3.25          3.68          2.62           4.69  ---------------------------------------------------------------------- Biophysical Evaluation  Amniotic F.V:   Within normal limits       F. Tone:        Observed  F. Movement:    Observed                   Score:          8/8  F. Breathing:   Observed ---------------------------------------------------------------------- OB History  Gravidity:    1  Living:       0 ---------------------------------------------------------------------- Gestational Age  LMP:           32w 6d        Date:  10/21/18                 EDD:   07/28/19  Best:          Milderd Meager 6d     Det. By:  LMP  (10/21/18)          EDD:   07/28/19 ---------------------------------------------------------------------- Anatomy  Thoracic:              Appears normal  Bladder:                Appears normal  Stomach:               Appears normal, left                         sided ---------------------------------------------------------------------- Doppler - Fetal Vessels  Umbilical Artery   S/D     %tile     RI              PI                     ADFV    RDFV  2.15       20   0.54             0.76                         N       N ---------------------------------------------------------------------- Cervix Uterus Adnexa  Cervix  Not visualized (advanced GA >24wks) ---------------------------------------------------------------------- Impression  Known IUGR  Biophysical profile 8/8  Normal UA Dopplers ---------------------------------------------------------------------- Recommendations  Continue weekly testing with UA Dopplers  Repeat growth in 3 weeks. ----------------------------------------------------------------------               Lin Landsman, MD Electronically Signed Final Report   06/08/2019 01:34 pm ----------------------------------------------------------------------  [place] Mfm Ua Cord Doppler  Result Date: 06/02/2019 ----------------------------------------------------------------------  OBSTETRICS REPORT                       (Signed  Final 06/02/2019 03:49 pm) ---------------------------------------------------------------------- Patient Info  ID #:       [id number]                          D.O.B.:  [birth year] (23 yrs)  Name:       Tonya Martin                     Visit Date: 06/02/2019 01:16 pm ---------------------------------------------------------------------- Performed By  Performed By:     Ellin Saba        Ref. Address:      [address]                    RDMS                                                              Rd                                                              Jacky Kindle  Attending:        Noralee Space MD        [facility]  Referred By:      Wilmer Floor LEFTWICH-                    KIRBY CNM ---------------------------------------------------------------------- Orders   #  Description                          Code         Ordered By   1  [place] MFM UA CORD DOPPLER               16109.60     Noralee Space  ----------------------------------------------------------------------   #  Order #                    Accession #                 Episode #   1  [id number]                  [id number]                  [id number]  ---------------------------------------------------------------------- Indications   Fetal abnormality - other known or             O35.9XX0   suspected (EIF, pyelectasis)   [redacted] weeks gestation of pregnancy                Z3A.32   Encounter for other antenatal screening        Z36.2   follow-up   Small for gestational age fetus affecting      O30.5990   management of mother  ---------------------------------------------------------------------- Vital Signs                                                 Height:        5'5" ---------------------------------------------------------------------- Fetal Evaluation  Num Of Fetuses:          1  Fetal Heart Rate(bpm):   131  Cardiac Activity:        Observed   Presentation:            Cephalic  Placenta:                Anterior  Amniotic Fluid  AFI FV:      Within normal limits  AFI Sum(cm)     %Tile       Largest Pocket(cm)  18.87           71          5.95  RUQ(cm)       RLQ(cm)       LUQ(cm)        LLQ(cm)  3.81          5.22          3.89           5.95 ---------------------------------------------------------------------- OB History  Gravidity:    1  Living:       0 ---------------------------------------------------------------------- Gestational Age  LMP:           32w 0d        Date:  10/21/18                 EDD:   07/28/19  Best:          Armida Sans  0d     Det. By:  LMP  (10/21/18)          EDD:   07/28/19 ---------------------------------------------------------------------- Doppler - Fetal Vessels  Umbilical Artery   S/D     %tile     RI              PI                     ADFV    RDFV  2.29       25   0.56             0.84                        No      No ---------------------------------------------------------------------- Impression  Fetal growth restriction.  Amniotic fluid is normal and good fetal activity is seen.  Umbilical artery Doppler showed normal forward diastolic  flow. ---------------------------------------------------------------------- Recommendations  -Continue weekly BPP and Doppler till delivery.  -Fetal growth in 2 weeks. ----------------------------------------------------------------------                  Noralee Space, MD Electronically Signed Final Report   06/02/2019 03:49 pm ----------------------------------------------------------------------  [place] Mfm Ua Cord Doppler  Result Date: 05/25/2019 ----------------------------------------------------------------------  OBSTETRICS REPORT                       (Signed Final 05/25/2019 01:21 pm) ---------------------------------------------------------------------- Patient Info  ID #:       [id number]                          D.O.B.:  June 05, 1996 (23 yrs)  Name:       Tonya Martin                      Visit Date: 05/25/2019 11:14 am ---------------------------------------------------------------------- Performed By  Performed By:     Hurman Horn          Ref. Address:     [address]                    RDMS                                                             Rd                                                             Jacky Kindle  Attending:        Noralee Space MD        [facility]  Referred  By:      LISA A LEFTWICH-                    KIRBY CNM ---------------------------------------------------------------------- Orders   #  Description                          Code         Ordered By   1  [place] MFM OB FOLLOW UP                  [id number]     Lin Landsman   2  [place] MFM UA CORD DOPPLER               16109.60     Lin Landsman  ----------------------------------------------------------------------   #  Order #                    Accession #                 Episode #   1  [id number]                  [id number]   2  [id number]                  [id number]                  [id number]  ---------------------------------------------------------------------- Indications   Fetal abnormality - other known or             O35.9XX0   suspected (EIF, pyelectasis)   Encounter for other antenatal screening        Z36.2   follow-up   Small for gestational age fetus affecting      O41.5990   management of mother   [redacted] weeks gestation of pregnancy                Z3A.30  ---------------------------------------------------------------------- Vital Signs                                                 Height:        5'5" ---------------------------------------------------------------------- Fetal Evaluation  Num Of Fetuses:         1  Fetal Heart Rate(bpm):  144  Cardiac Activity:        Observed  Presentation:           Breech  Placenta:               Anterior  P. Cord Insertion:      Visualized  Amniotic  Fluid  AFI FV:      Within normal limits  AFI Sum(cm)     %Tile       Largest Pocket(cm)  20.62           80          6.03  RUQ(cm)       RLQ(cm)       LUQ(cm)        LLQ(cm)  4.44          6.03          5.35           4.8 ---------------------------------------------------------------------- Biometry  BPD:      78.1  mm     G. Age:  31w 2d         54  %    CI:        76.31   %    70 - 86                                                          FL/HC:      19.0   %    19.3 - 21.3  HC:      283.3  mm     G. Age:  31w 1d         20  %    HC/AC:      1.12        0.96 - 1.17  AC:      252.4  mm     G. Age:  29w 3d         11  %    FL/BPD:     68.8   %    71 - 87  FL:       53.7  mm     G. Age:  28w 3d          1  %    FL/AC:      21.3   %    20 - 24  HUM:      49.3  mm     G. Age:  29w 0d         12  %  Est. FW:    1388  gm      3 lb 1 oz      6  % ---------------------------------------------------------------------- OB History  Gravidity:    1  Living:       0 ---------------------------------------------------------------------- Gestational Age  LMP:           30w 6d        Date:  10/21/18                 EDD:   07/28/19  U/S Today:     30w 1d                                        EDD:   08/02/19  Best:          30w 6d     Det. By:  LMP  (10/21/18)          EDD:   07/28/19 ---------------------------------------------------------------------- Anatomy  Cranium:  Appears normal         Aortic Arch:            Previously seen  Cavum:                 Appears normal         Ductal Arch:            Previously seen  Ventricles:            Appears normal         Diaphragm:              Appears normal  Choroid Plexus:        Previously seen        Stomach:                Appears normal, left                                                                        sided  Cerebellum:             Previously seen        Abdomen:                Appears normal  Posterior Fossa:       Previously seen        Abdominal Wall:         Previously seen  Nuchal Fold:           Previously seen        Cord Vessels:           Previously seen  Face:                  Appears normal         Kidneys:                Appear normal                         (orbits and profile)  Lips:                  Appears normal         Bladder:                Appears normal  Thoracic:              Appears normal         Spine:                  Previously seen  Heart:                 Bilateral              Upper Extremities:      Previously seen                         echogenic focus                         prev se  RVOT:  Appears normal         Lower Extremities:      Previously seen  LVOT:                  Appears normal  Other:  Female gender. Nasal bone previously visualized. 5th digit visualized.          Technically difficult due to fetal position. ---------------------------------------------------------------------- Doppler - Fetal Vessels  Umbilical Artery   S/D     %tile     RI              PI                     ADFV    RDFV  2.33       23   0.57             0.82                        No      No ---------------------------------------------------------------------- Impression  Patient returned for fetal growth and renal assessments.  Bilateral urinary tract dilations (UTD) were seen on previous  ultrasound.  On ultrasound, the estimated fetal weight is at the 6th  percentile. Femur length measures at less than the 5th  percentile. Amniotic fluid is normal and good fetal activity is  seen. Umbilical artery Doppler, performed because of  suspected fetal growth restriction, showed normal forward  diastolic flow.  I explained the findings consistent with fetal growth  restriction. I also informed her that because it is difficult to  differentiate between growth restriction and a constitutionally  small fetus, we  recommend close fetal surveillance till  delivery. ---------------------------------------------------------------------- Recommendations  -Weekly UA Doppler.  -Fetal growth assessment in 3 weeks. ----------------------------------------------------------------------                  Noralee Space, MD Electronically Signed Final Report   05/25/2019 01:21 pm ----------------------------------------------------------------------   Assessment and Plan:  Pregnancy: G1P0 at [redacted]w[redacted]d  1. Supervision of high risk pregnancy, antepartum  2. IUGR (intrauterine growth restriction) affecting care of mother, third trimester, fetus 1 - weekly BPP's and UA Dopplers, and ultrasounds for interval growth every 3 weeks, per MFM  3. Backache symptom Rx: - Elastic Bandages & Supports (COMFORT FIT MATERNITY SUPP SM) MISC; Wear as directed.  Dispense: 1 each; Refill: 0  4. GERD without esophagitis Rx: - omeprazole (PRILOSEC) 20 MG capsule; Take 1 capsule (20 mg total) by mouth 2 (two) times daily before a meal.  Dispense: 60 capsule; Refill: 5   Preterm labor symptoms and general obstetric precautions including but not limited to vaginal bleeding, contractions, leaking of fluid and fetal movement were reviewed in detail with the patient. I discussed the assessment and treatment plan with the patient. The patient was provided an opportunity to ask questions and all were answered. The patient agreed with the plan and demonstrated an understanding of the instructions. The patient was advised to call back or seek an in-person office evaluation/go to [facility] at [facility] for any urgent or concerning symptoms. Please refer to After Visit Summary for other counseling recommendations.   I provided 10 minutes of face-to-face time during this encounter.  Return in about 2 months (around 08/15/2019) for MyChart.  Future Appointments  Date Time Provider Department Center  06/16/2019 10:30 AM [facility] NURSE [facility]  [facility]  06/16/2019 10:30 AM [facility] [place] 1 [facility]  06/22/2019  11:00 AM [facility] NURSE [facility]  06/22/2019 11:00 AM [facility] [place] 3 [facility] M[facility]-US    Coral Ceo, MD Center for [facility], [facility] 06/15/2019

## 2019-06-15 NOTE — Progress Notes (Signed)
Virtual ROB  Pt checked B/P while on phone during work-up   B/P:125/62  P : 84

## 2019-06-16 ENCOUNTER — Other Ambulatory Visit: Payer: Self-pay

## 2019-06-16 ENCOUNTER — Ambulatory Visit (HOSPITAL_COMMUNITY): Payer: Medicaid Other | Admitting: *Deleted

## 2019-06-16 ENCOUNTER — Ambulatory Visit (HOSPITAL_COMMUNITY)
Admission: RE | Admit: 2019-06-16 | Discharge: 2019-06-16 | Disposition: A | Discharge status: Home / Self Care | Payer: Medicaid Other | Source: Ambulatory Visit | Attending: Obstetrics and Gynecology | Admitting: Obstetrics and Gynecology

## 2019-06-16 ENCOUNTER — Encounter (HOSPITAL_COMMUNITY): Payer: Self-pay

## 2019-06-16 ENCOUNTER — Other Ambulatory Visit (HOSPITAL_COMMUNITY): Payer: Self-pay | Admitting: *Deleted

## 2019-06-16 DIAGNOSIS — O365990 Maternal care for other known or suspected poor fetal growth, unspecified trimester, not applicable or unspecified: Secondary | ICD-10-CM

## 2019-06-16 DIAGNOSIS — O365930 Maternal care for other known or suspected poor fetal growth, third trimester, not applicable or unspecified: Secondary | ICD-10-CM

## 2019-06-16 DIAGNOSIS — Z362 Encounter for other antenatal screening follow-up: Secondary | ICD-10-CM | POA: Diagnosis not present

## 2019-06-16 DIAGNOSIS — Z3A34 34 weeks gestation of pregnancy: Secondary | ICD-10-CM | POA: Diagnosis not present

## 2019-06-16 DIAGNOSIS — O359XX0 Maternal care for (suspected) fetal abnormality and damage, unspecified, not applicable or unspecified: Secondary | ICD-10-CM

## 2019-06-16 NOTE — Procedures (Signed)
Tonya Martin Jun 18, 1996 [redacted]w[redacted]d  Fetus A Non-Stress Test Interpretation for 06/16/19  Indication: IUGR  Fetal Heart Rate A Mode: External Baseline Rate (A): 135 bpm Variability: Moderate Accelerations: 15 x 15 Decelerations: None Multiple birth?: No  Uterine Activity Mode: Palpation, Toco Contraction Frequency (min): none Resting Tone Palpated: Relaxed Resting Time: Adequate  Interpretation (Fetal Testing) Nonstress Test Interpretation: Reactive Comments: Reviewed tracing with Dr. Gertie Exon

## 2019-06-18 ENCOUNTER — Other Ambulatory Visit (HOSPITAL_COMMUNITY): Payer: Self-pay | Admitting: Obstetrics and Gynecology

## 2019-06-22 ENCOUNTER — Encounter (HOSPITAL_COMMUNITY): Payer: Self-pay

## 2019-06-22 ENCOUNTER — Ambulatory Visit (HOSPITAL_COMMUNITY): Payer: Medicaid Other | Admitting: *Deleted

## 2019-06-22 ENCOUNTER — Ambulatory Visit (HOSPITAL_COMMUNITY)
Admission: RE | Admit: 2019-06-22 | Discharge: 2019-06-22 | Disposition: A | Discharge status: Home / Self Care | Payer: Medicaid Other | Source: Ambulatory Visit | Attending: Obstetrics and Gynecology | Admitting: Obstetrics and Gynecology

## 2019-06-22 ENCOUNTER — Other Ambulatory Visit: Payer: Self-pay

## 2019-06-22 DIAGNOSIS — Z3A34 34 weeks gestation of pregnancy: Secondary | ICD-10-CM | POA: Diagnosis not present

## 2019-06-22 DIAGNOSIS — O365930 Maternal care for other known or suspected poor fetal growth, third trimester, not applicable or unspecified: Secondary | ICD-10-CM

## 2019-06-22 DIAGNOSIS — Z3400 Encounter for supervision of normal first pregnancy, unspecified trimester: Secondary | ICD-10-CM

## 2019-06-22 DIAGNOSIS — O359XX0 Maternal care for (suspected) fetal abnormality and damage, unspecified, not applicable or unspecified: Secondary | ICD-10-CM | POA: Diagnosis not present

## 2019-06-29 ENCOUNTER — Other Ambulatory Visit (HOSPITAL_COMMUNITY): Payer: Self-pay | Admitting: *Deleted

## 2019-06-29 ENCOUNTER — Encounter (HOSPITAL_COMMUNITY): Payer: Self-pay

## 2019-06-29 ENCOUNTER — Ambulatory Visit (INDEPENDENT_AMBULATORY_CARE_PROVIDER_SITE_OTHER): Payer: Medicaid Other | Admitting: Obstetrics & Gynecology

## 2019-06-29 ENCOUNTER — Ambulatory Visit (HOSPITAL_COMMUNITY): Payer: Medicaid Other | Admitting: *Deleted

## 2019-06-29 ENCOUNTER — Other Ambulatory Visit (HOSPITAL_COMMUNITY): Payer: Self-pay | Admitting: Maternal & Fetal Medicine

## 2019-06-29 ENCOUNTER — Other Ambulatory Visit: Payer: Self-pay

## 2019-06-29 ENCOUNTER — Other Ambulatory Visit (HOSPITAL_COMMUNITY)
Admission: RE | Admit: 2019-06-29 | Discharge: 2019-06-29 | Disposition: A | Discharge status: Home / Self Care | Payer: Medicaid Other | Source: Ambulatory Visit | Attending: Obstetrics & Gynecology | Admitting: Obstetrics & Gynecology

## 2019-06-29 ENCOUNTER — Ambulatory Visit (HOSPITAL_COMMUNITY)
Admission: RE | Admit: 2019-06-29 | Discharge: 2019-06-29 | Disposition: A | Discharge status: Home / Self Care | Payer: Medicaid Other | Source: Ambulatory Visit | Attending: Obstetrics and Gynecology | Admitting: Obstetrics and Gynecology

## 2019-06-29 ENCOUNTER — Other Ambulatory Visit: Payer: Self-pay | Admitting: Obstetrics & Gynecology

## 2019-06-29 VITALS — BP 115/75 | HR 80 | Wt 201.6 lb

## 2019-06-29 DIAGNOSIS — Z3A35 35 weeks gestation of pregnancy: Secondary | ICD-10-CM | POA: Diagnosis not present

## 2019-06-29 DIAGNOSIS — Z3480 Encounter for supervision of other normal pregnancy, unspecified trimester: Secondary | ICD-10-CM

## 2019-06-29 DIAGNOSIS — O365990 Maternal care for other known or suspected poor fetal growth, unspecified trimester, not applicable or unspecified: Secondary | ICD-10-CM

## 2019-06-29 DIAGNOSIS — O365930 Maternal care for other known or suspected poor fetal growth, third trimester, not applicable or unspecified: Secondary | ICD-10-CM

## 2019-06-29 DIAGNOSIS — O359XX0 Maternal care for (suspected) fetal abnormality and damage, unspecified, not applicable or unspecified: Secondary | ICD-10-CM | POA: Diagnosis not present

## 2019-06-29 DIAGNOSIS — O365993 Maternal care for other known or suspected poor fetal growth, unspecified trimester, fetus 3: Secondary | ICD-10-CM

## 2019-06-29 NOTE — Progress Notes (Signed)
   PRENATAL VISIT NOTE  Subjective:  Tonya Martin is a 23 y.o. G1P0 at [redacted]w[redacted]d being seen today for ongoing prenatal care.  She is currently monitored for the following issues for this low-risk pregnancy and has Adjustment disorder with mixed anxiety and depressed mood; Food allergy; Other seasonal allergic rhinitis; Supervision of normal pregnancy, antepartum; and Pregnancy affected by fetal growth restriction on their problem list.  Patient reports occ leg numbness adn pain out of buttocks on the right side. .  Contractions: Irritability. Vag. Bleeding: None.  Movement: Present. Denies leaking of fluid.   The following portions of the patient's history were reviewed and updated as appropriate: allergies, current medications, past family history, past medical history, past social history, past surgical history and problem list.   Objective:   Vitals:   06/29/19 1122  BP: 115/75  Pulse: 80  Weight: 201 lb 9.6 oz (91.4 kg)    Fetal Status: Fetal Heart Rate (bpm): 135   Movement: Present     General:  Alert, oriented and cooperative. Patient is in no acute distress.  Skin: Skin is warm and dry. No rash noted.   Cardiovascular: Normal heart rate noted  Respiratory: Normal respiratory effort, no problems with respiration noted  Abdomen: Soft, gravid, appropriate for gestational age.  Pain/Pressure: Present     Pelvic: Cervical exam performed        Extremities: Normal range of motion.  Edema: Trace  Mental Status: Normal mood and affect. Normal behavior. Normal judgment and thought content.   Assessment and Plan:  Pregnancy: G1P0 at [redacted]w[redacted]d 1. Supervision of other normal pregnancy, antepartum S<D has [place] for next week   2. Pregnancy affected by fetal growth restriction [place] scheudled for 9/30  Known IUGR prior exam EFW 4% with AC 5th%  Normal UA Dopplers and Biophysical profile today.  EIF again seen ---------------------------------------------------------------------- Recommendations   Continue weekly BPP and UA dopplers  Repeat growth scheduled on 9/30.  Preterm labor symptoms and general obstetric precautions including but not limited to vaginal bleeding, contractions, leaking of fluid and fetal movement were reviewed in detail with the patient. Please refer to After Visit Summary for other counseling recommendations.   Return in about 2 weeks (around 07/13/2019) for in person.  Future Appointments  Date Time Provider [place]  07/07/2019 11:00 AM [facility]  07/07/2019 11:00 AM [facility] [place] 3 [facility] M[facility]  07/13/2019 11:15 AM [facility]-MFC [place] 4 WH-MFCUS MFC-US    Lavonia Drafts, MD

## 2019-06-29 NOTE — Patient Instructions (Signed)
Third Trimester of Pregnancy The third trimester is from week 28 through week 40 (months 7 through 9). The third trimester is a time when the unborn baby (fetus) is growing rapidly. At the end of the ninth month, the fetus is about 20 inches in length and weighs 6-10 pounds. Body changes during your third trimester Your body will continue to go through many changes during pregnancy. The changes vary from woman to woman. During the third trimester:  Your weight will continue to increase. You can expect to gain 25-35 pounds (11-16 kg) by the end of the pregnancy.  You may begin to get stretch marks on your hips, abdomen, and breasts.  You may urinate more often because the fetus is moving lower into your pelvis and pressing on your bladder.  You may develop or continue to have heartburn. This is caused by increased hormones that slow down muscles in the digestive tract.  You may develop or continue to have constipation because increased hormones slow digestion and cause the muscles that push waste through your intestines to relax.  You may develop hemorrhoids. These are swollen veins (varicose veins) in the rectum that can itch or be painful.  You may develop swollen, bulging veins (varicose veins) in your legs.  You may have increased body aches in the pelvis, back, or thighs. This is due to weight gain and increased hormones that are relaxing your joints.  You may have changes in your hair. These can include thickening of your hair, rapid growth, and changes in texture. Some women also have hair loss during or after pregnancy, or hair that feels dry or thin. Your hair will most likely return to normal after your baby is born.  Your breasts will continue to grow and they will continue to become tender. A yellow fluid (colostrum) may leak from your breasts. This is the first milk you are producing for your baby.  Your belly button may stick out.  You may notice more swelling in your hands,  face, or ankles.  You may have increased tingling or numbness in your hands, arms, and legs. The skin on your belly may also feel numb.  You may feel short of breath because of your expanding uterus.  You may have more problems sleeping. This can be caused by the size of your belly, increased need to urinate, and an increase in your body's metabolism.  You may notice the fetus "dropping," or moving lower in your abdomen (lightening).  You may have increased vaginal discharge.  You may notice your joints feel loose and you may have pain around your pelvic bone. What to expect at prenatal visits You will have prenatal exams every 2 weeks until week 36. Then you will have weekly prenatal exams. During a routine prenatal visit:  You will be weighed to make sure you and the baby are growing normally.  Your blood pressure will be taken.  Your abdomen will be measured to track your baby's growth.  The fetal heartbeat will be listened to.  Any test results from the previous visit will be discussed.  You may have a cervical check near your due date to see if your cervix has softened or thinned (effaced).  You will be tested for Group B streptococcus. This happens between 35 and 37 weeks. Your health care provider may ask you:  What your birth plan is.  How you are feeling.  If you are feeling the baby move.  If you have had any abnormal   symptoms, such as leaking fluid, bleeding, severe headaches, or abdominal cramping.  If you are using any tobacco products, including cigarettes, chewing tobacco, and electronic cigarettes.  If you have any questions. Other tests or screenings that may be performed during your third trimester include:  Blood tests that check for low iron levels (anemia).  Fetal testing to check the health, activity level, and growth of the fetus. Testing is done if you have certain medical conditions or if there are problems during the pregnancy.  Nonstress test  (NST). This test checks the health of your baby to make sure there are no signs of problems, such as the baby not getting enough oxygen. During this test, a belt is placed around your belly. The baby is made to move, and its heart rate is monitored during movement. What is false labor? False labor is a condition in which you feel small, irregular tightenings of the muscles in the womb (contractions) that usually go away with rest, changing position, or drinking water. These are called Braxton Hicks contractions. Contractions may last for hours, days, or even weeks before true labor sets in. If contractions come at regular intervals, become more frequent, increase in intensity, or become painful, you should see your health care provider. What are the signs of labor?  Abdominal cramps.  Regular contractions that start at 10 minutes apart and become stronger and more frequent with time.  Contractions that start on the top of the uterus and spread down to the lower abdomen and back.  Increased pelvic pressure and dull back pain.  A watery or bloody mucus discharge that comes from the vagina.  Leaking of amniotic fluid. This is also known as your "water breaking." It could be a slow trickle or a gush. Let your health care provider know if it has a color or strange odor. If you have any of these signs, call your health care provider right away, even if it is before your due date. Follow these instructions at home: Medicines  Follow your health care provider's instructions regarding medicine use. Specific medicines may be either safe or unsafe to take during pregnancy.  Take a prenatal vitamin that contains at least 600 micrograms (mcg) of folic acid.  If you develop constipation, try taking a stool softener if your health care provider approves. Eating and drinking   Eat a balanced diet that includes fresh fruits and vegetables, whole grains, good sources of protein such as meat, eggs, or tofu,  and low-fat dairy. Your health care provider will help you determine the amount of weight gain that is right for you.  Avoid raw meat and uncooked cheese. These carry germs that can cause birth defects in the baby.  If you have low calcium intake from food, talk to your health care provider about whether you should take a daily calcium supplement.  Eat four or five small meals rather than three large meals a day.  Limit foods that are high in fat and processed sugars, such as fried and sweet foods.  To prevent constipation: ? Drink enough fluid to keep your urine clear or pale yellow. ? Eat foods that are high in fiber, such as fresh fruits and vegetables, whole grains, and beans. Activity  Exercise only as directed by your health care provider. Most women can continue their usual exercise routine during pregnancy. Try to exercise for 30 minutes at least 5 days a week. Stop exercising if you experience uterine contractions.  Avoid heavy lifting.  Do   not exercise in extreme heat or humidity, or at high altitudes.  Wear low-heel, comfortable shoes.  Practice good posture.  You may continue to have sex unless your health care provider tells you otherwise. Relieving pain and discomfort  Take frequent breaks and rest with your legs elevated if you have leg cramps or low back pain.  Take warm sitz baths to soothe any pain or discomfort caused by hemorrhoids. Use hemorrhoid cream if your health care provider approves.  Wear a good support bra to prevent discomfort from breast tenderness.  If you develop varicose veins: ? Wear support pantyhose or compression stockings as told by your healthcare provider. ? Elevate your feet for 15 minutes, 3-4 times a day. Prenatal care  Write down your questions. Take them to your prenatal visits.  Keep all your prenatal visits as told by your health care provider. This is important. Safety  Wear your seat belt at all times when driving.  Make  a list of emergency phone numbers, including numbers for family, friends, the hospital, and police and fire departments. General instructions  Avoid cat litter boxes and soil used by cats. These carry germs that can cause birth defects in the baby. If you have a cat, ask someone to clean the litter box for you.  Do not travel far distances unless it is absolutely necessary and only with the approval of your health care provider.  Do not use hot tubs, steam rooms, or saunas.  Do not drink alcohol.  Do not use any products that contain nicotine or tobacco, such as cigarettes and e-cigarettes. If you need help quitting, ask your health care provider.  Do not use any medicinal herbs or unprescribed drugs. These chemicals affect the formation and growth of the baby.  Do not douche or use tampons or scented sanitary pads.  Do not cross your legs for long periods of time.  To prepare for the arrival of your baby: ? Take prenatal classes to understand, practice, and ask questions about labor and delivery. ? Make a trial run to the hospital. ? Visit the hospital and tour the maternity area. ? Arrange for maternity or paternity leave through employers. ? Arrange for family and friends to take care of pets while you are in the hospital. ? Purchase a rear-facing car seat and make sure you know how to install it in your car. ? Pack your hospital bag. ? Prepare the baby's nursery. Make sure to remove all pillows and stuffed animals from the baby's crib to prevent suffocation.  Visit your dentist if you have not gone during your pregnancy. Use a soft toothbrush to brush your teeth and be gentle when you floss. Contact a health care provider if:  You are unsure if you are in labor or if your water has broken.  You become dizzy.  You have mild pelvic cramps, pelvic pressure, or nagging pain in your abdominal area.  You have lower back pain.  You have persistent nausea, vomiting, or diarrhea.   You have an unusual or bad smelling vaginal discharge.  You have pain when you urinate. Get help right away if:  Your water breaks before 37 weeks.  You have regular contractions less than 5 minutes apart before 37 weeks.  You have a fever.  You are leaking fluid from your vagina.  You have spotting or bleeding from your vagina.  You have severe abdominal pain or cramping.  You have rapid weight loss or weight gain.  You have   shortness of breath with chest pain.  You notice sudden or extreme swelling of your face, hands, ankles, feet, or legs.  Your baby makes fewer than 10 movements in 2 hours.  You have severe headaches that do not go away when you take medicine.  You have vision changes. Summary  The third trimester is from week 28 through week 40, months 7 through 9. The third trimester is a time when the unborn baby (fetus) is growing rapidly.  During the third trimester, your discomfort may increase as you and your baby continue to gain weight. You may have abdominal, leg, and back pain, sleeping problems, and an increased need to urinate.  During the third trimester your breasts will keep growing and they will continue to become tender. A yellow fluid (colostrum) may leak from your breasts. This is the first milk you are producing for your baby.  False labor is a condition in which you feel small, irregular tightenings of the muscles in the womb (contractions) that eventually go away. These are called Braxton Hicks contractions. Contractions may last for hours, days, or even weeks before true labor sets in.  Signs of labor can include: abdominal cramps; regular contractions that start at 10 minutes apart and become stronger and more frequent with time; watery or bloody mucus discharge that comes from the vagina; increased pelvic pressure and dull back pain; and leaking of amniotic fluid. This information is not intended to replace advice given to you by your health  care provider. Make sure you discuss any questions you have with your health care provider. Document Released: 09/17/2001 Document Revised: 01/14/2019 Document Reviewed: 10/29/2016 Elsevier Patient Education  2020 [place] Natural childbirth is when labor and delivery progresses naturally with minimal medical assistance or medicines. Natural childbirth may be an option if you have a low risk pregnancy. With the help of a birthing professional such as a midwife, doula, or other health care provider, you may be able to use relaxation techniques and controlled breathing to manage pain during labor. Many women choose natural childbirth because it makes them feel more in control and in touch with the experience of giving birth. Some women also choose natural childbirth because they are concerned that medicines may affect them and their babies. What types of natural childbirth techniques are available? There are two types of natural childbirth techniques, which are taught in classes:  The Lamaze method. In these classes, parents learn that having a baby is normal, healthy, and natural. Mothers are taught to take a neutral position regarding pain medicine and numbing medicines, and to make an informed decision about using these medicines if the time comes.  The [employer], also called husband-coached birth. In these classes, the father or partner learns to be the birth coach. The mother is encouraged to exercise and eat a balanced, nutritious diet. Both parents also learn relaxation and deep breathing exercises and are taught how to prepare for emergency situations. What are some natural pain and relaxation techniques? If you are considering a natural childbirth, you should explore your options for managing pain and discomfort during your labor and delivery. Some natural pain and relaxation techniques include:  Meditation.  Yoga.  Hypnosis.  Acupuncture.  Massage.   Changing positions, such as by walking, rocking, showering, or leaning on birth balls.  Lying in warm water or a whirlpool bath.  Finding an activity that keeps your mind off the labor pain.  Listening to soft music.  Focusing on  a particular object (visual imagery). How can I prepare for a natural birth?  Talk with your spouse or partner about your goals for having a natural childbirth.  Decide if you will have your baby in the hospital, at a birthing center, or at home.  Have your spouse or partner attend the natural childbirth technique classes with you.  Decide which type of health care provider you would like to assist you with your delivery.  If you have other children, make plans to have someone take care of them when you go to the hospital or birthing center.  Know the distance and the time it takes to go to the hospital or birthing center. Practice going there and time it to be sure.  Have a bag packed with a nightgown, bathrobe, and toiletries. Be ready to take it with you when you go into labor.  Keep phone numbers of your family and friends handy if you need to call someone when you go into labor.  Talk with your health care provider about the possibility of a medical emergency and what will happen if that occurs. What are the advantages and disadvantages of natural childbirth? Advantages  You are in control of your labor and delivery experience.  You may be able to avoid some common medical practices, such as getting medicines or being monitored all the time.  You and your spouse or partner will work together, which can increase your bond with each other.  In most delivery centers, your family and friends can be involved in the labor and delivery process. Disadvantages  The methods of helping relieve labor pains may not work for you.  You may feel disappointed if you change your mind during labor and decide not to have a natural childbirth. What can I expect  after delivery?  You may feel very tired.  You may feel uncomfortable as your uterus contracts.  The area around your vagina will feel sore.  You may feel cold and shaky. What questions should I ask my health care provider?  Am I a good candidate for natural childbirth?  Can you refer me to classes to learn more about natural childbirth?  How do I create a birth plan that outlines my wishes for natural childbirth? Where to find more information  American Pregnancy Association: americanpregnancy.org  [employer] of Obst[employer]ians and Gynecologists: acog.org  [employer] of Nurse-Midwives: www.midwife.org Summary  Natural childbirth may be an option if you have a low risk pregnancy.  The [employer] or Lamaze Methods are techniques that can assist you in achieving a natural birth experience.  Talk to your health care provider to determine if you are a good candidate for a natural childbirth. This information is not intended to replace advice given to you by your health care provider. Make sure you discuss any questions you have with your health care provider. Document Released: 09/05/2008 Document Revised: 01/15/2019 Document Reviewed: 12/02/2016 Elsevier Patient Education  2020 [employer].

## 2019-06-30 ENCOUNTER — Other Ambulatory Visit (HOSPITAL_COMMUNITY): Payer: Self-pay | Admitting: Maternal & Fetal Medicine

## 2019-06-30 DIAGNOSIS — O365990 Maternal care for other known or suspected poor fetal growth, unspecified trimester, not applicable or unspecified: Secondary | ICD-10-CM

## 2019-06-30 LAB — CERVICOVAGINAL ANCILLARY ONLY
Chlamydia: NEGATIVE
Neisseria Gonorrhea: NEGATIVE

## 2019-07-01 LAB — STREP GP B NAA: Strep Gp B NAA: NEGATIVE

## 2019-07-05 ENCOUNTER — Ambulatory Visit (INDEPENDENT_AMBULATORY_CARE_PROVIDER_SITE_OTHER): Payer: Medicaid Other | Admitting: Advanced Practice Midwife

## 2019-07-05 ENCOUNTER — Other Ambulatory Visit: Payer: Self-pay

## 2019-07-05 VITALS — BP 133/79 | HR 85 | Wt 204.0 lb

## 2019-07-05 DIAGNOSIS — Z3A36 36 weeks gestation of pregnancy: Secondary | ICD-10-CM

## 2019-07-05 DIAGNOSIS — O365990 Maternal care for other known or suspected poor fetal growth, unspecified trimester, not applicable or unspecified: Secondary | ICD-10-CM

## 2019-07-05 DIAGNOSIS — M5430 Sciatica, unspecified side: Secondary | ICD-10-CM

## 2019-07-05 DIAGNOSIS — O365930 Maternal care for other known or suspected poor fetal growth, third trimester, not applicable or unspecified: Secondary | ICD-10-CM

## 2019-07-05 DIAGNOSIS — Z3480 Encounter for supervision of other normal pregnancy, unspecified trimester: Secondary | ICD-10-CM

## 2019-07-05 NOTE — Progress Notes (Signed)
PRENATAL VISIT NOTE  Subjective:  Tonya Martin is a 23 y.o. G1P0 at [redacted]w[redacted]d being seen today for ongoing prenatal care.  She is currently monitored for the following issues for this low-risk pregnancy and has Adjustment disorder with mixed anxiety and depressed mood; Food allergy; Other seasonal allergic rhinitis; Supervision of normal pregnancy, antepartum; and Pregnancy affected by fetal growth restriction on their problem list.  Tonya Martin complains of feeling very tired, due to being uncomfortable when trying to sleep, and also of right sided sciatic pain. She has been unable to get comfortable when sleeping and has difficulty staying asleep. She has had right sided sciatic pain radiating from her right buttocks down the back of her leg. She does not wear the pregnancy belt, due to be uncomfortable. 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 Contractions: Not present. Vag. Bleeding: None.  Movement: Present. Denies leaking of fluid. Denies vaginal pruritis, burning, or odor.   Denies dizziness, lightheadedness, vision changes, headache, chest pain, SOB, swelling in hands and face, and rapid increases in weight.   The following portions of the patient's history were reviewed and updated as appropriate: allergies, current medications, past family history, past medical history, past social history, past surgical history and problem list.   Objective:   Vitals:   07/05/19 1613  BP: 133/79  Pulse: 85  Weight: 204 lb (92.5 kg)    Fetal Status: Fetal Heart Rate (bpm): 140   Movement: Present     General:  Alert, oriented and cooperative. Patient is in no acute distress.  Skin: Skin is warm and dry. No rash noted.   Cardiovascular: Normal heart rate noted. RRR, no murmurs appreciated  Respiratory: Normal respiratory effort, no problems with respiration noted, clear to ausculation bilaterally  Abdomen: Soft, gravid, appropriate for gestational age.  Pain/Pressure: Absent     Pelvic:  Cervical exam deferred        Extremities: Normal range of motion.     Mental Status: Normal mood and affect. Normal behavior. Normal judgment and thought content.   Assessment and Plan:  Pregnancy: G1P0 at [redacted]w[redacted]d 1. Supervision of other normal pregnancy, antepartum - Reassured pt that difficulties with sleep is normal and that this should improve somewhat after pregnancy. - Discussed sleep hygiene with pt and recommended benadryl for sleep if needed.   2. Pregnancy affected by fetal growth restriction - MFM growth ultrasound scheduled for 9/30, will determine if she will be induced at 37wks  - Discussed IOL with pt and what to expect in the hospital on L&D - Answered pts questions about length of hospital stay and what may happen with her baby after delivery   3. Sciatica, unspecified laterality - Discussed pregnancy as cause of sciatica with pt and that this should resolve after her pregnancy.  - Advised pt to wear pregnancy belt during the day and while at work.  - Recommended icing the area and provided pt with exercises/stretches to do at home.   Term labor symptoms and general obstetric precautions including but not limited to vaginal bleeding, contractions, leaking of fluid and fetal movement were reviewed in detail with the patient. Please refer to After Visit Summary for other counseling recommendations.   Return in about 1 week (around 07/12/2019).  Future Appointments  Date Time Provider [place]  07/07/2019 11:00 AM [facility]  07/07/2019 11:00 AM [facility] [place] 3 [facility]  07/13/2019 11:15 AM [facility] NURSE [facility]  07/13/2019 11:15 AM  [facility] [place] 4 [facility] M[facility]-US    Osvaldo Shipper, Medical Student

## 2019-07-05 NOTE — Patient Instructions (Signed)
Sciatica Rehab Ask your health care provider which exercises are safe for you. Do exercises exactly as told by your health care provider and adjust them as directed. It is normal to feel mild stretching, pulling, tightness, or discomfort as you do these exercises. Stop right away if you feel sudden pain or your pain gets worse. Do not begin these exercises until told by your health care provider. Stretching and range-of-motion exercises These exercises warm up your muscles and joints and improve the movement and flexibility of your hips and back. These exercises also help to relieve pain, numbness, and tingling. Sciatic nerve glide 1. Sit in a chair with your head facing down toward your chest. Place your hands behind your back. Let your shoulders slump forward. 2. Slowly straighten one of your legs while you tilt your head back as if you are looking toward the ceiling. Only straighten your leg as far as you can without making your symptoms worse. 3. Hold this position for __________ seconds. 4. Slowly return to the starting position. 5. Repeat with your other leg. Repeat __________ times. Complete this exercise __________ times a day. Knee to chest with hip adduction and internal rotation  1. Lie on your back on a firm surface with both legs straight. 2. Bend one of your knees and move it up toward your chest until you feel a gentle stretch in your lower back and buttock. Then, move your knee toward the shoulder that is on the opposite side from your leg. This is hip adduction and internal rotation. ? Hold your leg in this position by holding on to the front of your knee. 3. Hold this position for __________ seconds. 4. Slowly return to the starting position. 5. Repeat with your other leg. Repeat __________ times. Complete this exercise __________ times a day. Prone extension on elbows  1. Lie on your abdomen on a firm surface. A bed may be too soft for this exercise. 2. Prop yourself up on  your elbows. 3. Use your arms to help lift your chest up until you feel a gentle stretch in your abdomen and your lower back. ? This will place some of your body weight on your elbows. If this is uncomfortable, try stacking pillows under your chest. ? Your hips should stay down, against the surface that you are lying on. Keep your hip and back muscles relaxed. 4. Hold this position for __________ seconds. 5. Slowly relax your upper body and return to the starting position. Repeat __________ times. Complete this exercise __________ times a day. Strengthening exercises These exercises build strength and endurance in your back. Endurance is the ability to use your muscles for a long time, even after they get tired. Pelvic tilt This exercise strengthens the muscles that lie deep in the abdomen. 1. Lie on your back on a firm surface. Bend your knees and keep your feet flat on the floor. 2. Tense your abdominal muscles. Tip your pelvis up toward the ceiling and flatten your lower back into the floor. ? To help with this exercise, you may place a small towel under your lower back and try to push your back into the towel. 3. Hold this position for __________ seconds. 4. Let your muscles relax completely before you repeat this exercise. Repeat __________ times. Complete this exercise __________ times a day. Alternating arm and leg raises  1. Get on your hands and knees on a firm surface. If you are on a hard floor, you may want to use  padding, such as an exercise mat, to cushion your knees. 2. Line up your arms and legs. Your hands should be directly below your shoulders, and your knees should be directly below your hips. 3. Lift your left leg behind you. At the same time, raise your right arm and straighten it in front of you. ? Do not lift your leg higher than your hip. ? Do not lift your arm higher than your shoulder. ? Keep your abdominal and back muscles tight. ? Keep your hips facing the  ground. ? Do not arch your back. ? Keep your balance carefully, and do not hold your breath. 4. Hold this position for __________ seconds. 5. Slowly return to the starting position. 6. Repeat with your right leg and your left arm. Repeat __________ times. Complete this exercise __________ times a day. Posture and body mechanics Good posture and healthy body mechanics can help to relieve stress in your body's tissues and joints. Body mechanics refers to the movements and positions of your body while you do your daily activities. Posture is part of body mechanics. Good posture means:  Your spine is in its natural S-curve position (neutral).  Your shoulders are pulled back slightly.  Your head is not tipped forward. Follow these guidelines to improve your posture and body mechanics in your everyday activities. Standing   When standing, keep your spine neutral and your feet about hip width apart. Keep a slight bend in your knees. Your ears, shoulders, and hips should line up.  When you do a task in which you stand in one place for a long time, place one foot up on a stable object that is 2-4 inches (5-10 cm) high, such as a footstool. This helps keep your spine neutral. Sitting   When sitting, keep your spine neutral and keep your feet flat on the floor. Use a footrest, if necessary, and keep your thighs parallel to the floor. Avoid rounding your shoulders, and avoid tilting your head forward.  When working at a desk or a computer, keep your desk at a height where your hands are slightly lower than your elbows. Slide your chair under your desk so you are close enough to maintain good posture.  When working at a computer, place your monitor at a height where you are looking straight ahead and you do not have to tilt your head forward or downward to look at the screen. Resting  When lying down and resting, avoid positions that are most painful for you.  If you have pain with activities  such as sitting, bending, stooping, or squatting, lie in a position in which your body does not bend very much. For example, avoid curling up on your side with your arms and knees near your chest (fetal position).  If you have pain with activities such as standing for a long time or reaching with your arms, lie with your spine in a neutral position and bend your knees slightly. Try the following positions: ? Lying on your side with a pillow between your knees. ? Lying on your back with a pillow under your knees. Lifting   When lifting objects, keep your feet at least shoulder width apart and tighten your abdominal muscles.  Bend your knees and hips and keep your spine neutral. It is important to lift using the strength of your legs, not your back. Do not lock your knees straight out.  Always ask for help to lift heavy or awkward objects. This information is not   intended to replace advice given to you by your health care provider. Make sure you discuss any questions you have with your health care provider. Document Released: 09/23/2005 Document Revised: 01/15/2019 Document Reviewed: 10/15/2018 Elsevier Patient Education  2020 [employer]   Back Pain in Pregnancy Back pain during pregnancy is common. Back pain may be caused by several factors that are related to changes during your pregnancy. Follow these instructions at home: Managing pain, stiffness, and swelling      If directed, for sudden (acute) back pain, put ice on the painful area. ? Put ice in a plastic bag. ? Place a towel between your skin and the bag. ? Leave the ice on for 20 minutes, 2-3 times per day.  If directed, apply heat to the affected area before you exercise. Use the heat source that your health care provider recommends, such as a moist heat pack or a heating pad. ? Place a towel between your skin and the heat source. ? Leave the heat on for 20-30 minutes. ? Remove the heat if your skin turns bright red.  This is especially important if you are unable to feel pain, heat, or cold. You may have a greater risk of getting burned.  If directed, massage the affected area. Activity  Exercise as told by your health care provider. Gentle exercise is the best way to prevent or manage back pain.  Listen to your body when lifting. If lifting hurts, ask for help or bend your knees. This uses your leg muscles instead of your back muscles.  Squat down when picking up something from the floor. Do not bend over.  Only use bed rest for short periods as told by your health care provider. Bed rest should only be used for the most severe episodes of back pain. Standing, sitting, and lying down  Do not stand in one place for long periods of time.  Use good posture when sitting. Make sure your head rests over your shoulders and is not hanging forward. Use a pillow on your lower back if necessary.  Try sleeping on your side, preferably the left side, with a pregnancy support pillow or 1-2 regular pillows between your legs. ? If you have back pain after a night's rest, your bed may be too soft. ? A firm mattress may provide more support for your back during pregnancy. General instructions  Do not wear high heels.  Eat a healthy diet. Try to gain weight within your health care provider's recommendations.  Use a maternity girdle, elastic sling, or back brace as told by your health care provider.  Take over-the-counter and prescription medicines only as told by your health care provider.  Work with a physical therapist or massage therapist to find ways to manage back pain. Acupuncture or massage therapy may be helpful.  Keep all follow-up visits as told by your health care provider. This is important. Contact a health care provider if:  Your back pain interferes with your daily activities.  You have increasing pain in other parts of your body. Get help right away if:  You develop numbness, tingling,  weakness, or problems with the use of your arms or legs.  You develop severe back pain that is not controlled with medicine.  You have a change in bowel or bladder control.  You develop shortness of breath, dizziness, or you faint.  You develop nausea, vomiting, or sweating.  You have back pain that is a rhythmic, cramping pain similar to labor pains.  Labor pain is usually 1-2 minutes apart, lasts for about 1 minute, and involves a bearing down feeling or pressure in your pelvis.  You have back pain and your water breaks or you have vaginal bleeding.  You have back pain or numbness that travels down your leg.  Your back pain developed after you fell.  You develop pain on one side of your back.  You see blood in your urine.  You develop skin blisters in the area of your back pain. Summary  Back pain may be caused by several factors that are related to changes during your pregnancy.  Follow instructions as told by your health care provider for managing pain, stiffness, and swelling.  Exercise as told by your health care provider. Gentle exercise is the best way to prevent or manage back pain.  Take over-the-counter and prescription medicines only as told by your health care provider.  Keep all follow-up visits as told by your health care provider. This is important. This information is not intended to replace advice given to you by your health care provider. Make sure you discuss any questions you have with your health care provider. Document Released: 01/01/2006 Document Revised: 01/12/2019 Document Reviewed: 03/11/2018 Elsevier Patient Education  2020 [employer].

## 2019-07-07 ENCOUNTER — Telehealth: Payer: Self-pay

## 2019-07-07 ENCOUNTER — Ambulatory Visit (HOSPITAL_COMMUNITY): Payer: Medicaid Other | Admitting: *Deleted

## 2019-07-07 ENCOUNTER — Inpatient Hospital Stay (HOSPITAL_COMMUNITY)
Admission: AD | Admit: 2019-07-07 | Discharge: 2019-07-07 | Disposition: A | Discharge status: Home / Self Care | Payer: Medicaid Other | Attending: Family Medicine | Admitting: Family Medicine

## 2019-07-07 ENCOUNTER — Encounter (HOSPITAL_COMMUNITY): Payer: Self-pay

## 2019-07-07 ENCOUNTER — Other Ambulatory Visit: Payer: Self-pay

## 2019-07-07 ENCOUNTER — Ambulatory Visit (HOSPITAL_COMMUNITY)
Admission: RE | Admit: 2019-07-07 | Discharge: 2019-07-07 | Disposition: A | Discharge status: Home / Self Care | Payer: Medicaid Other | Source: Ambulatory Visit | Attending: Obstetrics and Gynecology | Admitting: Obstetrics and Gynecology

## 2019-07-07 DIAGNOSIS — O479 False labor, unspecified: Secondary | ICD-10-CM

## 2019-07-07 DIAGNOSIS — O365990 Maternal care for other known or suspected poor fetal growth, unspecified trimester, not applicable or unspecified: Secondary | ICD-10-CM | POA: Diagnosis present

## 2019-07-07 DIAGNOSIS — O365930 Maternal care for other known or suspected poor fetal growth, third trimester, not applicable or unspecified: Secondary | ICD-10-CM | POA: Insufficient documentation

## 2019-07-07 DIAGNOSIS — Z3A37 37 weeks gestation of pregnancy: Secondary | ICD-10-CM

## 2019-07-07 DIAGNOSIS — Z362 Encounter for other antenatal screening follow-up: Secondary | ICD-10-CM | POA: Diagnosis not present

## 2019-07-07 DIAGNOSIS — O471 False labor at or after 37 completed weeks of gestation: Secondary | ICD-10-CM | POA: Insufficient documentation

## 2019-07-07 DIAGNOSIS — O359XX0 Maternal care for (suspected) fetal abnormality and damage, unspecified, not applicable or unspecified: Secondary | ICD-10-CM

## 2019-07-07 NOTE — MAU Note (Signed)
Pt states that she went to her u/s appointment today and they told her the baby gained weight appropriately. Pt states she was going to be induced if the baby had not gained the weight.   Pt states that she was eating a bagel at 1400. She states she started having severe abdominal cramping every minute.  Pt reports it stopping around 1630.  Denies vaginal bleeding or LOF.   Pt reports +FM

## 2019-07-07 NOTE — Telephone Encounter (Signed)
Patient states that she thinks that she has started having contractions. She states that she is cramping really bad and her abdomen is tightening. She states that this started at 2 pm today. The last contraction she had was at 4:30. Patient then states that she was at MAU currently being evaluated.

## 2019-07-08 ENCOUNTER — Other Ambulatory Visit (HOSPITAL_COMMUNITY): Payer: Self-pay | Admitting: Maternal & Fetal Medicine

## 2019-07-08 DIAGNOSIS — O365930 Maternal care for other known or suspected poor fetal growth, third trimester, not applicable or unspecified: Secondary | ICD-10-CM

## 2019-07-08 DIAGNOSIS — O365990 Maternal care for other known or suspected poor fetal growth, unspecified trimester, not applicable or unspecified: Secondary | ICD-10-CM

## 2019-07-12 ENCOUNTER — Ambulatory Visit (INDEPENDENT_AMBULATORY_CARE_PROVIDER_SITE_OTHER): Payer: Medicaid Other | Admitting: Advanced Practice Midwife

## 2019-07-12 ENCOUNTER — Other Ambulatory Visit: Payer: Self-pay

## 2019-07-12 VITALS — BP 127/77 | HR 75 | Wt 206.0 lb

## 2019-07-12 DIAGNOSIS — Z3A37 37 weeks gestation of pregnancy: Secondary | ICD-10-CM

## 2019-07-12 DIAGNOSIS — O0993 Supervision of high risk pregnancy, unspecified, third trimester: Secondary | ICD-10-CM

## 2019-07-12 DIAGNOSIS — O365931 Maternal care for other known or suspected poor fetal growth, third trimester, fetus 1: Secondary | ICD-10-CM

## 2019-07-12 DIAGNOSIS — O0990 Supervision of high risk pregnancy, unspecified, unspecified trimester: Secondary | ICD-10-CM

## 2019-07-12 NOTE — Progress Notes (Signed)
   PRENATAL VISIT NOTE  Subjective:  Tonya Martin is a 23 y.o. G1P0 at [redacted]w[redacted]d being seen today for ongoing prenatal care.  She is currently monitored for the following issues for this high-risk pregnancy and has Adjustment disorder with mixed anxiety and depressed mood; Food allergy; Other seasonal allergic rhinitis; Supervision of normal pregnancy, antepartum; and Pregnancy affected by fetal growth restriction on their problem list.    Patient has no complaints. Denies headaches, SOB, chest pain, or lower extremity swelling.  Contractions: Irregular. Endorses [employer] contractions. Vag. Bleeding: None.  Movement: Present. Denies leaking of fluid.   The following portions of the patient's history were reviewed and updated as appropriate: allergies, current medications, past family history, past medical history, past social history, past surgical history and problem list.   Objective:   Vitals:   07/12/19 1515  BP: 127/77  Pulse: 75  Weight: 93.4 kg    Fetal Status: Fetal Heart Rate (bpm): 135   Movement: Present     General:  Alert, oriented and cooperative. Patient is in no acute distress.  Skin: Skin is warm and dry. No rash noted.   Cardiovascular: Normal heart rate noted  Respiratory: Normal respiratory effort, no problems with respiration noted  Abdomen: Soft, gravid, appropriate for gestational age.  Pain/Pressure: Present     Pelvic: Cervical exam deferred        Extremities: Normal range of motion.  Edema: None  Mental Status: Normal mood and affect. Normal behavior. Normal judgment and thought content.   Assessment and Plan:  Pregnancy: G1P0 at [redacted]w[redacted]d 1. Supervision of high-risk pregnancy, antepartum - Continue scheduled appointments with MFM for IUGR. - Discussed with patient recent US findings and will continue to monitor baby's growth.   Term labor symptoms and general obstetric precautions including but not limited to vaginal bleeding, contractions, leaking of  fluid and fetal movement were reviewed in detail with the patient. Please refer to After Visit Summary for other counseling recommendations.  Patient plans on breast feeding.   Return in about 1 week (around 07/19/2019).  Future Appointments  Date Time Provider [place]  07/13/2019 11:15 AM [place] NURSE [place] [facility]  07/13/2019 11:15 AM [facility] [place] [facility]    Tonya Martin Tonya Martin, Student-PA

## 2019-07-12 NOTE — Progress Notes (Signed)
Patient reports fetal movement and irregular contractions. 

## 2019-07-12 NOTE — Patient Instructions (Signed)
Labor Precautions Reasons to come to MAU at [facility]:  1.  Contractions are  5 minutes apart or less, each last 1 minute, these have been going on for 1-2 hours, and you cannot walk or talk during them 2.  You have a large gush of fluid, or a trickle of fluid that will not stop and you have to wear a pad 3.  You have bleeding that is bright red, heavier than spotting--like menstrual bleeding (spotting can be normal in early labor or after a check of your cervix) 4.  You do not feel the baby moving like he/she normally does  

## 2019-07-13 ENCOUNTER — Ambulatory Visit (HOSPITAL_COMMUNITY)
Admission: RE | Admit: 2019-07-13 | Discharge: 2019-07-13 | Disposition: A | Discharge status: Home / Self Care | Payer: Medicaid Other | Source: Ambulatory Visit | Attending: Maternal & Fetal Medicine | Admitting: Maternal & Fetal Medicine

## 2019-07-13 ENCOUNTER — Encounter (HOSPITAL_COMMUNITY): Payer: Self-pay

## 2019-07-13 ENCOUNTER — Ambulatory Visit (HOSPITAL_COMMUNITY): Payer: Medicaid Other | Admitting: *Deleted

## 2019-07-13 VITALS — BP 127/71 | HR 84 | Temp 98.7°F

## 2019-07-13 DIAGNOSIS — O365930 Maternal care for other known or suspected poor fetal growth, third trimester, not applicable or unspecified: Secondary | ICD-10-CM

## 2019-07-13 DIAGNOSIS — O359XX0 Maternal care for (suspected) fetal abnormality and damage, unspecified, not applicable or unspecified: Secondary | ICD-10-CM

## 2019-07-13 DIAGNOSIS — Z3A37 37 weeks gestation of pregnancy: Secondary | ICD-10-CM | POA: Diagnosis not present

## 2019-07-14 ENCOUNTER — Other Ambulatory Visit (HOSPITAL_COMMUNITY): Payer: Medicaid Other

## 2019-07-19 ENCOUNTER — Encounter: Payer: Medicaid Other | Admitting: Advanced Practice Midwife

## 2019-07-21 ENCOUNTER — Other Ambulatory Visit: Payer: Self-pay

## 2019-07-21 ENCOUNTER — Ambulatory Visit (INDEPENDENT_AMBULATORY_CARE_PROVIDER_SITE_OTHER): Payer: Medicaid Other | Admitting: Family Medicine

## 2019-07-21 VITALS — BP 130/81 | HR 75 | Wt 209.5 lb

## 2019-07-21 DIAGNOSIS — Z3480 Encounter for supervision of other normal pregnancy, unspecified trimester: Secondary | ICD-10-CM

## 2019-07-21 DIAGNOSIS — Z3A39 39 weeks gestation of pregnancy: Secondary | ICD-10-CM

## 2019-07-21 DIAGNOSIS — O365930 Maternal care for other known or suspected poor fetal growth, third trimester, not applicable or unspecified: Secondary | ICD-10-CM

## 2019-07-21 DIAGNOSIS — O365990 Maternal care for other known or suspected poor fetal growth, unspecified trimester, not applicable or unspecified: Secondary | ICD-10-CM

## 2019-07-21 NOTE — Progress Notes (Signed)
Subjective:  Tonya Martin is a 23 y.o. G1P0 at [redacted]w[redacted]d being seen today for ongoing prenatal care.  She is currently monitored for the following issues for this low-risk pregnancy and has Adjustment disorder with mixed anxiety and depressed mood; Food allergy; Other seasonal allergic rhinitis; and Supervision of normal pregnancy, antepartum on their problem list.  Patient reports no complaints.  Contractions: Not present. Vag. Bleeding: None.  Movement: Present. Denies leaking of fluid.   The following portions of the patient's history were reviewed and updated as appropriate: allergies, current medications, past family history, past medical history, past social history, past surgical history and problem list. Problem list updated.  Objective:   Vitals:   07/21/19 1447  BP: 130/81  Pulse: 75  Weight: 209 lb 8 oz (95 kg)    Fetal Status: Fetal Heart Rate (bpm): 140 Fundal Height: 39 cm Movement: Present     General:  Alert, oriented and cooperative. Patient is in no acute distress.  Skin: Skin is warm and dry. No rash noted.   Cardiovascular: Normal heart rate noted  Respiratory: Normal respiratory effort, no problems with respiration noted  Abdomen: Soft, gravid, appropriate for gestational age. Pain/Pressure: Absent     Pelvic: Vag. Bleeding: None     Cervical exam deferred per patient request        Extremities: Normal range of motion.  Edema: None  Mental Status: Normal mood and affect. Normal behavior. Normal judgment and thought content.    Assessment and Plan:  Pregnancy: G1P0 at [redacted]w[redacted]d  1. Supervision of other normal pregnancy, antepartum - continue routine antenatal care - discussed natural ways to induce labor such as IC  2. Pregnancy affected by fetal growth restriction - last BPP showed normal growth, weekly BPPs discontinued per MFM - resolved  Term labor symptoms and general obstetric precautions including but not limited to vaginal bleeding, contractions, leaking of  fluid and fetal movement were reviewed in detail with the patient. Please refer to After Visit Summary for other counseling recommendations.  Return in about 1 week (around 07/28/2019) for LOB, w/ NST at visit.   Kalah Pflum L, DO

## 2019-07-21 NOTE — Patient Instructions (Signed)

## 2019-07-28 ENCOUNTER — Telehealth (HOSPITAL_COMMUNITY): Payer: Self-pay | Admitting: *Deleted

## 2019-07-28 ENCOUNTER — Encounter: Payer: Self-pay | Admitting: Obstetrics and Gynecology

## 2019-07-28 ENCOUNTER — Other Ambulatory Visit: Payer: Self-pay

## 2019-07-28 ENCOUNTER — Encounter (HOSPITAL_COMMUNITY): Payer: Self-pay

## 2019-07-28 ENCOUNTER — Encounter (HOSPITAL_COMMUNITY): Payer: Self-pay | Admitting: *Deleted

## 2019-07-28 ENCOUNTER — Inpatient Hospital Stay (HOSPITAL_COMMUNITY)
Admission: AD | Admit: 2019-07-28 | Discharge: 2019-07-28 | Disposition: A | Discharge status: Home / Self Care | Payer: Medicaid Other | Attending: Obstetrics & Gynecology | Admitting: Obstetrics & Gynecology

## 2019-07-28 ENCOUNTER — Ambulatory Visit (INDEPENDENT_AMBULATORY_CARE_PROVIDER_SITE_OTHER): Payer: Medicaid Other | Admitting: Obstetrics and Gynecology

## 2019-07-28 ENCOUNTER — Other Ambulatory Visit: Payer: Self-pay | Admitting: Advanced Practice Midwife

## 2019-07-28 VITALS — BP 123/80 | HR 78 | Wt 209.0 lb

## 2019-07-28 DIAGNOSIS — O26893 Other specified pregnancy related conditions, third trimester: Secondary | ICD-10-CM | POA: Insufficient documentation

## 2019-07-28 DIAGNOSIS — R030 Elevated blood-pressure reading, without diagnosis of hypertension: Secondary | ICD-10-CM | POA: Diagnosis not present

## 2019-07-28 DIAGNOSIS — Z3A40 40 weeks gestation of pregnancy: Secondary | ICD-10-CM

## 2019-07-28 DIAGNOSIS — Z3689 Encounter for other specified antenatal screening: Secondary | ICD-10-CM | POA: Diagnosis not present

## 2019-07-28 DIAGNOSIS — Z3403 Encounter for supervision of normal first pregnancy, third trimester: Secondary | ICD-10-CM | POA: Diagnosis not present

## 2019-07-28 DIAGNOSIS — Z3400 Encounter for supervision of normal first pregnancy, unspecified trimester: Secondary | ICD-10-CM

## 2019-07-28 NOTE — Progress Notes (Signed)
ROB NST 

## 2019-07-28 NOTE — Progress Notes (Signed)
Called [place] CNM with pt's discharge BP readings, states pt okay to be discharged but should call the office to follow up tomorrow for BP check. Pt verbalizes understanding.

## 2019-07-28 NOTE — MAU Note (Signed)
Sent from clinic, non-reactive NST. Denies pain, bleeding or leaking.

## 2019-07-28 NOTE — Progress Notes (Signed)
Subjective:  Tonya Martin is a 23 y.o. G1P0 at [redacted]w[redacted]d being seen today for ongoing prenatal care.  She is currently monitored for the following issues for this low-risk pregnancy and has Adjustment disorder with mixed anxiety and depressed mood; Food allergy; Other seasonal allergic rhinitis; and Supervision of normal pregnancy, antepartum on their problem list.  Patient reports general discomforts of pregnancy.  Contractions: Not present. Vag. Bleeding: None.  Movement: Present. Denies leaking of fluid.   The following portions of the patient's history were reviewed and updated as appropriate: allergies, current medications, past family history, past medical history, past social history, past surgical history and problem list. Problem list updated.  Objective:   Vitals:   07/28/19 1039  BP: 123/80  Pulse: 78  Weight: 209 lb (94.8 kg)    Fetal Status:     Movement: Present     General:  Alert, oriented and cooperative. Patient is in no acute distress.  Skin: Skin is warm and dry. No rash noted.   Cardiovascular: Normal heart rate noted  Respiratory: Normal respiratory effort, no problems with respiration noted  Abdomen: Soft, gravid, appropriate for gestational age. Pain/Pressure: Present     Pelvic:  Cervical exam performed        Extremities: Normal range of motion.  Edema: None  Mental Status: Normal mood and affect. Normal behavior. Normal judgment and thought content.   Urinalysis:      Assessment and Plan:  Pregnancy: G1P0 at [redacted]w[redacted]d  1. Supervision of normal first pregnancy, antepartum Stable Labor precautions To MAU for additional monitoring due to non reactive NST IOL schedule at 41 weeks  NST: Baseline 140's, 1 accel, no decels, occ ut ctx Non reactive NST, negative CST  - Fetal nonstress test; Future  Term labor symptoms and general obstetric precautions including but not limited to vaginal bleeding, contractions, leaking of fluid and fetal movement were reviewed in  detail with the patient. Please refer to After Visit Summary for other counseling recommendations.  Return for 5 weeks postpartum from 08/04/2019.   Chancy Milroy, MD

## 2019-07-28 NOTE — MAU Provider Note (Signed)
First Provider Initiated Contact with Patient 07/28/19 1222     S: [name] is a 23 y.o. G1P0 at [redacted]w[redacted]d  who presents to MAU today from [facility] clinic for evaluation of non-reactive NST in clinic. She denies contractions. She denies vaginal bleeding. She denies LOF. She reports normal fetal movement.    O: BP 132/72   Pulse 99   Temp 98.9 F (37.2 C) (Oral)   Resp 16   LMP 10/21/2018 (Approximate)   SpO2 99%  GENERAL: Well-developed, well-nourished female in no acute distress.  HEAD: Normocephalic, atraumatic.  CHEST: Normal effort of breathing, regular heart rate ABDOMEN: Soft, nontender, gravid  Cervical exam: closed/thick/posterior in clinic    Fetal Monitoring: Baseline: 135 Variability: Moderate Accelerations: Multiple 15 x 15 Decelerations: None Contractions: Occasional, UI  A: SIUP at [redacted]w[redacted]d  Reactive tracing Questionable BP elevation at discharge, reading obtained during patient movement. BP normal when rechecked less than one minute later. No history of BP elevation. Likely compromised reading.  Tracing reviewed by Dr. Harolyn Rutherford prior to discharge   P: Discharge home in stable condition with labor precautions Pt advised to check BP on home cuff this afternoon, have virtual BP check with RN tomorrow Reviewed interventions for cervical ripening with patient and FOB (via Facetime)  F/U: --IOL scheduled for 0700 on 08/04/19  Mallie Snooks, CNM 07/28/2019 2:22 PM

## 2019-07-28 NOTE — Telephone Encounter (Signed)
Preadmission screen  

## 2019-07-28 NOTE — Patient Instructions (Signed)

## 2019-07-28 NOTE — Discharge Instructions (Signed)
Cervical Ripening: May try one or both  Red Raspberry Leaf capsules:  two 300mg  or 400mg  tablets with each meal, 2-3 times a day  Potential Side Effects Of Raspberry Leaf:  Most women do not experience any side effects from drinking raspberry leaf tea. However, nausea and loose stools are possible   Evening Primrose Oil capsules: may take 1 to 3 capsules daily. May also prick one to release the oil and insert it into your vagina at night.  Some of the potential side effects:  Upset stomach  Loose stools or diarrhea  Headaches  Nausea:     Signs and Symptoms of Labor Labor is your body's natural process of moving your baby, placenta, and umbilical cord out of your uterus. The process of labor usually starts when your baby is full-term, between 59 and 40 weeks of pregnancy. How will I know when I am close to going into labor? As your body prepares for labor and the birth of your baby, you may notice the following symptoms in the weeks and days before true labor starts:  Having a strong desire to get your home ready to receive your new baby. This is called nesting. Nesting may be a sign that labor is approaching, and it may occur several weeks before birth. Nesting may involve cleaning and organizing your home.  Passing a small amount of thick, bloody mucus out of your vagina (normal bloody show or losing your mucus plug). This may happen more than a week before labor begins, or it might occur right before labor begins as the opening of the cervix starts to widen (dilate). For some women, the entire mucus plug passes at once. For others, smaller portions of the mucus plug may gradually pass over several days.  Your baby moving (dropping) lower in your pelvis to get into position for birth (lightening). When this happens, you may feel more pressure on your bladder and pelvic bone and less pressure on your ribs. This may make it easier to breathe. It may also cause you to need to urinate more  often and have problems with bowel movements.  Having "practice contractions" (Braxton Hicks contractions) that occur at irregular (unevenly spaced) intervals that are more than 10 minutes apart. This is also called false labor. False labor contractions are common after exercise or sexual activity, and they will stop if you change position, rest, or drink fluids. These contractions are usually mild and do not get stronger over time. They may feel like: ? A backache or back pain. ? Mild cramps, similar to menstrual cramps. ? Tightening or pressure in your abdomen. Other early symptoms that labor may be starting soon include:  Nausea or loss of appetite.  Diarrhea.  Having a sudden burst of energy, or feeling very tired.  Mood changes.  Having trouble sleeping. How will I know when labor has begun? Signs that true labor has begun may include:  Having contractions that come at regular (evenly spaced) intervals and increase in intensity. This may feel like more intense tightening or pressure in your abdomen that moves to your back. ? Contractions may also feel like rhythmic pain in your upper thighs or back that comes and goes at regular intervals. ? For first-time mothers, this change in intensity of contractions often occurs at a more gradual pace. ? Women who have given birth before may notice a more rapid progression of contraction changes.  Having a feeling of pressure in the vaginal area.  Your water breaking (rupture  of membranes). This is when the sac of fluid that surrounds your baby breaks. When this happens, you will notice fluid leaking from your vagina. This may be clear or blood-tinged. Labor usually starts within 24 hours of your water breaking, but it may take longer to begin. ? Some women notice this as a gush of fluid. ? Others notice that their underwear repeatedly becomes damp. Follow these instructions at home:   When labor starts, or if your water breaks, call your  health care provider or nurse care line. Based on your situation, they will determine when you should go in for an exam.  When you are in early labor, you may be able to rest and manage symptoms at home. Some strategies to try at home include: ? Breathing and relaxation techniques. ? Taking a warm bath or shower. ? Listening to music. ? Using a heating pad on the lower back for pain. If you are directed to use heat:  Place a towel between your skin and the heat source.  Leave the heat on for 20-30 minutes.  Remove the heat if your skin turns bright red. This is especially important if you are unable to feel pain, heat, or cold. You may have a greater risk of getting burned. Get help right away if:  You have painful, regular contractions that are 5 minutes apart or less.  Labor starts before you are [redacted] weeks along in your pregnancy.  You have a fever.  You have a headache that does not go away.  You have bright red blood coming from your vagina.  You do not feel your baby moving.  You have a sudden onset of: ? Severe headache with vision problems. ? Nausea, vomiting, or diarrhea. ? Chest pain or shortness of breath. These symptoms may be an emergency. If your health care provider recommends that you go to the hospital or birth center where you plan to deliver, do not drive yourself. Have someone else drive you, or call emergency services (911 in the [place] Summary  Labor is your body's natural process of moving your baby, placenta, and umbilical cord out of your uterus.  The process of labor usually starts when your baby is full-term, between 56 and 40 weeks of pregnancy.  When labor starts, or if your water breaks, call your health care provider or nurse care line. Based on your situation, they will determine when you should go in for an exam. This information is not intended to replace advice given to you by your health care provider. Make sure you discuss any questions you  have with your health care provider. Document Released: 02/28/2017 Document Revised: 06/23/2017 Document Reviewed: 02/28/2017 Elsevier Patient Education  2020 [employer].

## 2019-07-29 ENCOUNTER — Other Ambulatory Visit: Payer: Self-pay

## 2019-07-29 MED ORDER — TERCONAZOLE 0.8 % VA CREA: 1.0000 | TOPICAL_CREAM | Freq: Every day | VAGINAL | 0 refills | Status: DC

## 2019-07-29 NOTE — Progress Notes (Signed)
Rx Terazol cream sent per protocol for yeast infection symptoms.

## 2019-08-01 ENCOUNTER — Encounter (HOSPITAL_COMMUNITY): Payer: Self-pay | Admitting: *Deleted

## 2019-08-01 ENCOUNTER — Inpatient Hospital Stay (EMERGENCY_DEPARTMENT_HOSPITAL)
Admission: AD | Admit: 2019-08-01 | Discharge: 2019-08-01 | Disposition: A | Discharge status: Home / Self Care | Payer: Medicaid Other | Source: Home / Self Care | Attending: Obstetrics and Gynecology | Admitting: Obstetrics and Gynecology

## 2019-08-01 ENCOUNTER — Inpatient Hospital Stay (HOSPITAL_COMMUNITY)
Admission: AD | Admit: 2019-08-01 | Discharge: 2019-08-01 | Disposition: A | Discharge status: Home / Self Care | Payer: Medicaid Other | Source: Home / Self Care | Attending: Obstetrics & Gynecology | Admitting: Obstetrics & Gynecology

## 2019-08-01 ENCOUNTER — Other Ambulatory Visit: Payer: Self-pay

## 2019-08-01 DIAGNOSIS — Z3A40 40 weeks gestation of pregnancy: Secondary | ICD-10-CM

## 2019-08-01 DIAGNOSIS — Z3689 Encounter for other specified antenatal screening: Secondary | ICD-10-CM | POA: Insufficient documentation

## 2019-08-01 DIAGNOSIS — O479 False labor, unspecified: Secondary | ICD-10-CM

## 2019-08-01 DIAGNOSIS — O471 False labor at or after 37 completed weeks of gestation: Secondary | ICD-10-CM

## 2019-08-01 DIAGNOSIS — O480 Post-term pregnancy: Secondary | ICD-10-CM | POA: Insufficient documentation

## 2019-08-01 HISTORY — DX: Depression, unspecified: F32.A

## 2019-08-01 HISTORY — DX: Anxiety disorder, unspecified: F41.9

## 2019-08-01 HISTORY — DX: Post-traumatic stress disorder, unspecified: F43.10

## 2019-08-01 NOTE — MAU Note (Signed)
Pt reports to MAU c/o lower abdominal pain and back pain. Pt states the pain is a 6/10. This started at 1900 and around 2027-2200 she was timing them around 2-4 min. No bleeding or LOF. +FM. Pt reports the pain is now every 5ish min.

## 2019-08-01 NOTE — Progress Notes (Addendum)
[name] is a G1P0 at [redacted]w[redacted]d seen in MAU for labor. RN labor check, not seen by provider. SVE by RN Dilation: 1 Effacement (%): Thick Exam by:: Lauren Cox RN    NST - FHR: 125 bpm / moderate variability / accels present / decels absent / TOCO: regular every 4-6 mins   Plan:  D/C home with labor precautions Keep scheduled appt for induction  Gifford Shave, MD  08/01/2019 2:49 AM   GME ATTESTATION:  I agree with the findings and the plan of care as documented in the resident's note.  Merilyn Baba, DO OB [facility] [place] for [employer]

## 2019-08-01 NOTE — MAU Note (Signed)
Tonya Martin is a 23 y.o. at [redacted]w[redacted]d here in MAU reporting: started having some pressure last night, was here over night. Was 1 cm. States the contractions are worse, they are about every 4 minutes. No bleeding, no LOF. +FM  Onset of complaint: ongoing but worse now  Pain score: 7/10  Vitals:   08/01/19 1459  BP: 130/74  Pulse: 88  Resp: 16  Temp: 98.6 F (37 C)  SpO2: 97%     FHT: +FM  Lab orders placed from triage: none

## 2019-08-01 NOTE — Discharge Instructions (Signed)
Fetal Movement Counts Patient Name: ________________________________________________ Patient Due Date: ____________________ What is a fetal movement count?  A fetal movement count is the number of times that you feel your baby move during a certain amount of time. This may also be called a fetal kick count. A fetal movement count is recommended for every pregnant woman. You may be asked to start counting fetal movements as early as week 28 of your pregnancy. Pay attention to when your baby is most active. You may notice your baby's sleep and wake cycles. You may also notice things that make your baby move more. You should do a fetal movement count:  When your baby is normally most active.  At the same time each day. A good time to count movements is while you are resting, after having something to eat and drink. How do I count fetal movements? 1. Find a quiet, comfortable area. Sit, or lie down on your side. 2. Write down the date, the start time and stop time, and the number of movements that you felt between those two times. Take this information with you to your health care visits. 3. For 2 hours, count kicks, flutters, swishes, rolls, and jabs. You should feel at least 10 movements during 2 hours. 4. You may stop counting after you have felt 10 movements. 5. If you do not feel 10 movements in 2 hours, have something to eat and drink. Then, keep resting and counting for 1 hour. If you feel at least 4 movements during that hour, you may stop counting. Contact a health care provider if:  You feel fewer than 4 movements in 2 hours.  Your baby is not moving like he or she usually does. Date: ____________ Start time: ____________ Stop time: ____________ Movements: ____________ Date: ____________ Start time: ____________ Stop time: ____________ Movements: ____________ Date: ____________ Start time: ____________ Stop time: ____________ Movements: ____________ Date: ____________ Start time:  ____________ Stop time: ____________ Movements: ____________ Date: ____________ Start time: ____________ Stop time: ____________ Movements: ____________ Date: ____________ Start time: ____________ Stop time: ____________ Movements: ____________ Date: ____________ Start time: ____________ Stop time: ____________ Movements: ____________ Date: ____________ Start time: ____________ Stop time: ____________ Movements: ____________ Date: ____________ Start time: ____________ Stop time: ____________ Movements: ____________ This information is not intended to replace advice given to you by your health care provider. Make sure you discuss any questions you have with your health care provider. Document Released: 10/23/2006 Document Revised: 10/13/2018 Document Reviewed: 11/02/2015 Elsevier Patient Education  2020 [employer] Braxton Hicks Contractions Contractions of the uterus can occur throughout pregnancy, but they are not always a sign that you are in labor. You may have practice contractions called Braxton Hicks contractions. These false labor contractions are sometimes confused with true labor. What are Braxton Hicks contractions? Braxton Hicks contractions are tightening movements that occur in the muscles of the uterus before labor. Unlike true labor contractions, these contractions do not result in opening (dilation) and thinning of the cervix. Toward the end of pregnancy (32-34 weeks), Braxton Hicks contractions can happen more often and may become stronger. These contractions are sometimes difficult to tell apart from true labor because they can be very uncomfortable. You should not feel embarrassed if you go to the hospital with false labor. Sometimes, the only way to tell if you are in true labor is for your health care provider to look for changes in the cervix. The health care provider will do a physical exam and may monitor your contractions. If you   are not in true labor, the exam should show  that your cervix is not dilating and your water has not broken. If there are no other health problems associated with your pregnancy, it is completely safe for you to be sent home with false labor. You may continue to have Braxton Hicks contractions until you go into true labor. How to tell the difference between true labor and false labor True labor  Contractions last 30-70 seconds.  Contractions become very regular.  Discomfort is usually felt in the top of the uterus, and it spreads to the lower abdomen and low back.  Contractions do not go away with walking.  Contractions usually become more intense and increase in frequency.  The cervix dilates and gets thinner. False labor  Contractions are usually shorter and not as strong as true labor contractions.  Contractions are usually irregular.  Contractions are often felt in the front of the lower abdomen and in the groin.  Contractions may go away when you walk around or change positions while lying down.  Contractions get weaker and are shorter-lasting as time goes on.  The cervix usually does not dilate or become thin. Follow these instructions at home:   Take over-the-counter and prescription medicines only as told by your health care provider.  Keep up with your usual exercises and follow other instructions from your health care provider.  Eat and drink lightly if you think you are going into labor.  If Braxton Hicks contractions are making you uncomfortable: ? Change your position from lying down or resting to walking, or change from walking to resting. ? Sit and rest in a tub of warm water. ? Drink enough fluid to keep your urine pale yellow. Dehydration may cause these contractions. ? Do slow and deep breathing several times an hour.  Keep all follow-up prenatal visits as told by your health care provider. This is important. Contact a health care provider if:  You have a fever.  You have continuous pain in  your abdomen. Get help right away if:  Your contractions become stronger, more regular, and closer together.  You have fluid leaking or gushing from your vagina.  You pass blood-tinged mucus (bloody show).  You have bleeding from your vagina.  You have low back pain that you never had before.  You feel your baby's head pushing down and causing pelvic pressure.  Your baby is not moving inside you as much as it used to. Summary  Contractions that occur before labor are called Braxton Hicks contractions, false labor, or practice contractions.  Braxton Hicks contractions are usually shorter, weaker, farther apart, and less regular than true labor contractions. True labor contractions usually become progressively stronger and regular, and they become more frequent.  Manage discomfort from Braxton Hicks contractions by changing position, resting in a warm bath, drinking plenty of water, or practicing deep breathing. This information is not intended to replace advice given to you by your health care provider. Make sure you discuss any questions you have with your health care provider. Document Released: 02/06/2017 Document Revised: 09/05/2017 Document Reviewed: 02/06/2017 Elsevier Patient Education  2020 Elsevier Inc.  

## 2019-08-01 NOTE — MAU Note (Signed)
I have communicated with K. Kooistra CNM and reviewed vital signs:  Vitals:   08/01/19 1459  BP: 130/74  Pulse: 88  Resp: 16  Temp: 98.6 F (37 C)  SpO2: 97%    Vaginal exam:  Dilation: 3 Effacement (%): 60 Cervical Position: Posterior Station: -3 Presentation: Vertex Exam by:: Erasmo Score RN,   Also reviewed contraction pattern and that non-stress test is reactive.  It has been documented that patient is contracting every 4-8 minutes with no cervical change over 2 hours not indicating active labor.  Patient denies any other complaints.  Based on this report provider has given order for discharge.  A discharge order and diagnosis entered by a provider.   Labor discharge instructions reviewed with patient.

## 2019-08-01 NOTE — Discharge Instructions (Signed)
First Stage of Labor °Labor is your body's natural process of moving your baby and other structures, including the placenta and umbilical cord, out of your uterus. There are three stages of labor. How long each stage lasts is different for every woman. But certain events happen during each stage that are the same for everyone. °· The first stage starts when true labor begins. This stage ends when your cervix, which is the opening from your uterus into your vagina, is completely open (dilated). °· The second stage begins when your cervix is fully dilated and you start pushing. This stage ends when your baby is born. °· The third stage is the delivery of the organ that nourished your baby during pregnancy (placenta). °First stage of labor °As your due date gets closer, you may start to notice certain physical changes that mean labor is going to start soon. You may feel that your baby has dropped lower into your pelvis. You may experience irregular, often painless, contractions that go away when you walk around or lie down (Braxton Hicks contractions). This is also called false labor. °The first stage of labor begins when you start having contractions that come at regular (evenly spaced) intervals and your cervix starts to get thinner and wider in preparation for your baby to pass through. Birth care providers measure the dilation of your cervix in centimeters (cm). One centimeter is a little less than one-half of an inch. The first stage ends when your cervix is dilated to 10 cm. The first stage of labor is divided into three phases: °· Early phase. °· Active phase. °· Transitional phase. °The length of the first stage of labor varies. It may be longer if this is your first pregnancy. You may spend most of this stage at home trying to relax and stay comfortable. °How does this affect me? °During the first stage of labor, you will move through three phases. °What happens in the early phase? °· You will start to have  regular contractions that last 30-60 seconds. Contractions may come every 5-20 minutes. Keep track of your contractions and call your birth care provider. °· Your water may break during this phase. °· You may notice a clear or slightly bloody discharge of mucus (mucus plug) from your vagina. °· Your cervix will dilate to 3-6 cm. °What happens in the active phase? °The active phase usually lasts 3-5 hours. You may go to the hospital or birth center around this time. During the active phase: °· Your contractions will become stronger, longer, and more uncomfortable. °· Your contractions may last 45-90 seconds and come every 3-5 minutes. °· You may feel lower back pain. °· Your birth care providers may examine your cervix and feel your belly to find the position of your baby. °· You may have a monitor strapped to your belly to measure your contractions and your baby's heart rate. °· You may start using your pain management options. °· Your cervix may be dilated to 6 cm and may start to dilate more quickly. °What happens in the transitional phase? °The transitional phase typically lasts from 30 minutes to 2 hours. At the end of this phase, your cervix will be fully dilated to 10 cm. During the transitional phase: °· Contractions will get stronger and longer. °· Contractions may last 60-90 seconds and come less than 2 minutes apart. °· You may feel hot flashes, chills, or nausea. °How does this affect my baby? °During the first stage of labor, your baby will   gradually move down into your birth canal. °Follow these instructions at home and in the hospital or birth center: ° °· When labor first begins, try to stay calm. You are still in the early phase. If it is night, try to get some sleep. If it is day, try to relax and save your energy. You may want to make some calls and get ready to go to the hospital or birth center. °· When you are in the early phase, try these methods to help ease discomfort: °? Deep breathing and  muscle relaxation. °? Taking a walk. °? Taking a warm bath or shower. °· Drink some fluids and have a light snack if you feel like it. °· Keep track of your contractions. °· Based on the plan you created with your birth care provider, call when your contractions indicate it is time. °· If your water breaks, note the time, color, and odor of the fluid. °· When you are in the active phase, do your breathing exercises and rely on your support people and your team of birth care providers. °Contact a health care provider if: °· Your contractions are strong and regular. °· You have lower back pain or cramping. °· Your water breaks. °· You lose your mucus plug. °Get help right away if you: °· Have a severe headache that does not go away. °· Have changes in your vision. °· Have severe pain in your upper belly. °· Do not feel the baby move. °· Have bright red bleeding. °Summary °· The first stage of labor starts when true labor begins, and it ends when your cervix is dilated to 10 cm. °· The first stage of labor has three phases: early, active, and transitional. °· Your baby moves into the birth canal during the first stage of labor. °· You may have contractions that become stronger and longer. You may also lose your mucus plug and have your water break. °· Call your birth care provider when your contractions are frequent and strong enough to go to the hospital or birth center. °This information is not intended to replace advice given to you by your health care provider. Make sure you discuss any questions you have with your health care provider. °Document Released: 12/07/2017 Document Revised: 01/14/2019 Document Reviewed: 12/07/2017 °Elsevier Patient Education © 2020 Else[employer] Inc. ° °

## 2019-08-01 NOTE — MAU Note (Signed)
I have communicated with Dr.Crezenzo and reviewed vital signs:  Vitals:   08/01/19 0149 08/01/19 0249  BP: 129/82 130/78  Pulse: 82 74  Resp:  17  Temp:  (!) 97.5 F (36.4 C)    Vaginal exam:  Dilation: 1 Effacement (%): Thick Exam by:: Brayden Betters RN ,   Also reviewed contraction pattern and that non-stress test is reactive.  It has been documented that patient is contracting every 1-5 minutes with no cervical change over 1 hours not indicating active labor.  Patient denies any other complaints.  Based on this report provider has given order for discharge.  A discharge order and diagnosis entered by a provider.   Labor discharge instructions reviewed with patient.

## 2019-08-02 ENCOUNTER — Other Ambulatory Visit: Payer: Self-pay

## 2019-08-02 ENCOUNTER — Inpatient Hospital Stay (HOSPITAL_COMMUNITY)
Admission: AD | Admit: 2019-08-02 | Discharge: 2019-08-04 | DRG: 807 | Disposition: A | Discharge status: Home / Self Care | Payer: Medicaid Other | Attending: Obstetrics and Gynecology | Admitting: Obstetrics and Gynecology

## 2019-08-02 ENCOUNTER — Inpatient Hospital Stay (HOSPITAL_COMMUNITY): Payer: Medicaid Other | Admitting: Anesthesiology

## 2019-08-02 ENCOUNTER — Other Ambulatory Visit (HOSPITAL_COMMUNITY): Payer: Medicaid Other | Attending: Obstetrics & Gynecology

## 2019-08-02 ENCOUNTER — Encounter (HOSPITAL_COMMUNITY): Payer: Self-pay

## 2019-08-02 DIAGNOSIS — O99214 Obesity complicating childbirth: Secondary | ICD-10-CM | POA: Diagnosis present

## 2019-08-02 DIAGNOSIS — Z3A40 40 weeks gestation of pregnancy: Secondary | ICD-10-CM

## 2019-08-02 DIAGNOSIS — O480 Post-term pregnancy: Secondary | ICD-10-CM | POA: Diagnosis present

## 2019-08-02 DIAGNOSIS — E669 Obesity, unspecified: Secondary | ICD-10-CM | POA: Diagnosis present

## 2019-08-02 DIAGNOSIS — Z20828 Contact with and (suspected) exposure to other viral communicable diseases: Secondary | ICD-10-CM | POA: Diagnosis present

## 2019-08-02 DIAGNOSIS — Z30430 Encounter for insertion of intrauterine contraceptive device: Secondary | ICD-10-CM

## 2019-08-02 DIAGNOSIS — O26893 Other specified pregnancy related conditions, third trimester: Principal | ICD-10-CM | POA: Diagnosis present

## 2019-08-02 DIAGNOSIS — O134 Gestational [pregnancy-induced] hypertension without significant proteinuria, complicating childbirth: Secondary | ICD-10-CM | POA: Diagnosis present

## 2019-08-02 DIAGNOSIS — Z3490 Encounter for supervision of normal pregnancy, unspecified, unspecified trimester: Secondary | ICD-10-CM

## 2019-08-02 DIAGNOSIS — Z975 Presence of (intrauterine) contraceptive device: Secondary | ICD-10-CM

## 2019-08-02 LAB — CBC
HCT: 39.3 % (ref 36.0–46.0)
HCT: 36.9 % (ref 36.0–46.0)
Hemoglobin: 13.5 g/dL (ref 12.0–15.0)
Hemoglobin: 12.7 g/dL (ref 12.0–15.0)
MCH: 29.8 pg (ref 26.0–34.0)
MCH: 30 pg (ref 26.0–34.0)
MCHC: 34.4 g/dL (ref 30.0–36.0)
MCHC: 34.4 g/dL (ref 30.0–36.0)
MCV: 86.8 fL (ref 80.0–100.0)
MCV: 87 fL (ref 80.0–100.0)
Platelets: 255 10*3/uL (ref 150–400)
Platelets: 229 10*3/uL (ref 150–400)
RBC: 4.53 MIL/uL (ref 3.87–5.11)
RBC: 4.24 MIL/uL (ref 3.87–5.11)
RDW: 12.4 % (ref 11.5–15.5)
RDW: 12.5 % (ref 11.5–15.5)
WBC: 9.7 10*3/uL (ref 4.0–10.5)
WBC: 13.1 10*3/uL — ABNORMAL HIGH (ref 4.0–10.5)
nRBC: 0 % (ref 0.0–0.2)
nRBC: 0 % (ref 0.0–0.2)

## 2019-08-02 LAB — PROTEIN / CREATININE RATIO, URINE
Creatinine, Urine: 150.43 mg/dL
Protein Creatinine Ratio: 0.15 mg/mg{Cre} (ref 0.00–0.15)
Total Protein, Urine: 23 mg/dL

## 2019-08-02 LAB — COMPREHENSIVE METABOLIC PANEL
ALT: 15 U/L (ref 0–44)
AST: 20 U/L (ref 15–41)
Albumin: 3.3 g/dL — ABNORMAL LOW (ref 3.5–5.0)
Alkaline Phosphatase: 87 U/L (ref 38–126)
Anion gap: 11 (ref 5–15)
BUN: 11 mg/dL (ref 6–20)
CO2: 19 mmol/L — ABNORMAL LOW (ref 22–32)
Calcium: 9.1 mg/dL (ref 8.9–10.3)
Chloride: 104 mmol/L (ref 98–111)
Creatinine, Ser: 0.62 mg/dL (ref 0.44–1.00)
GFR calc Af Amer: >60 mL/min (ref >60)
GFR calc non Af Amer: >60 mL/min (ref >60)
Glucose, Bld: 97 mg/dL (ref 70–99)
Potassium: 3.7 mmol/L (ref 3.5–5.1)
Sodium: 134 mmol/L — ABNORMAL LOW (ref 135–145)
Total Bilirubin: 0.3 mg/dL (ref 0.3–1.2)
Total Protein: 6.6 g/dL (ref 6.5–8.1)

## 2019-08-02 LAB — RPR: RPR Ser Ql: NONREACTIVE

## 2019-08-02 LAB — SARS CORONAVIRUS 2 BY RT PCR (HOSPITAL ORDER, PERFORMED IN ~~LOC~~ HOSPITAL LAB): SARS Coronavirus 2: NEGATIVE

## 2019-08-02 LAB — ABO/RH: ABO/RH(D): O POS

## 2019-08-02 LAB — TYPE AND SCREEN
ABO/RH(D): O POS
Antibody Screen: NEGATIVE

## 2019-08-02 MED ORDER — OXYTOCIN 40 UNITS IN NORMAL SALINE INFUSION - SIMPLE MED
1.0000 m[IU]/min | INTRAVENOUS | Status: DC
  Administered 2019-08-02: 2 m[IU]/min via INTRAVENOUS
  Filled 2019-08-02: qty 1000

## 2019-08-02 MED ORDER — LACTATED RINGERS IV SOLN
INTRAVENOUS | Status: DC
  Administered 2019-08-02: 09:00:00 via INTRAVENOUS

## 2019-08-02 MED ORDER — LEVONORGESTREL 19.5 MCG/DAY IU IUD
INTRAUTERINE_SYSTEM | Freq: Once | INTRAUTERINE | Status: AC
  Administered 2019-08-02: 1 via INTRAUTERINE
  Filled 2019-08-02: qty 1

## 2019-08-02 MED ORDER — MISOPROSTOL 25 MCG QUARTER TABLET: 25.0000 ug | ORAL_TABLET | ORAL | Status: DC | PRN

## 2019-08-02 MED ORDER — ONDANSETRON HCL 4 MG/2ML IJ SOLN: 4.0000 mg | Freq: Four times a day (QID) | INTRAMUSCULAR | Status: DC | PRN

## 2019-08-02 MED ORDER — OXYTOCIN BOLUS FROM INFUSION: 500.0000 mL | Freq: Once | INTRAVENOUS | Status: DC

## 2019-08-02 MED ORDER — OXYTOCIN 40 UNITS IN NORMAL SALINE INFUSION - SIMPLE MED: 2.5000 [IU]/h | INTRAVENOUS | Status: DC

## 2019-08-02 MED ORDER — PHENYLEPHRINE 40 MCG/ML (10ML) SYRINGE FOR IV PUSH (FOR BLOOD PRESSURE SUPPORT): 80.0000 ug | PREFILLED_SYRINGE | INTRAVENOUS | Status: DC | PRN

## 2019-08-02 MED ORDER — LIDOCAINE HCL (PF) 1 % IJ SOLN: 30.0000 mL | INTRAMUSCULAR | Status: DC | PRN

## 2019-08-02 MED ORDER — ACETAMINOPHEN 325 MG PO TABS: 650.0000 mg | ORAL_TABLET | ORAL | Status: DC | PRN

## 2019-08-02 MED ORDER — DIPHENHYDRAMINE HCL 50 MG/ML IJ SOLN: 12.5000 mg | INTRAMUSCULAR | Status: DC | PRN

## 2019-08-02 MED ORDER — OXYCODONE-ACETAMINOPHEN 5-325 MG PO TABS: 2.0000 | ORAL_TABLET | ORAL | Status: DC | PRN

## 2019-08-02 MED ORDER — EPHEDRINE 5 MG/ML INJ: 10.0000 mg | INTRAVENOUS | Status: DC | PRN

## 2019-08-02 MED ORDER — DIBUCAINE (PERIANAL) 1 % EX OINT: 1.0000 "application " | TOPICAL_OINTMENT | CUTANEOUS | Status: DC | PRN

## 2019-08-02 MED ORDER — SOD CITRATE-CITRIC ACID 500-334 MG/5ML PO SOLN: 30.0000 mL | ORAL | Status: DC | PRN

## 2019-08-02 MED ORDER — TERBUTALINE SULFATE 1 MG/ML IJ SOLN: 0.2500 mg | Freq: Once | INTRAMUSCULAR | Status: DC | PRN

## 2019-08-02 MED ORDER — EPHEDRINE 5 MG/ML INJ
10.0000 mg | INTRAVENOUS | Status: DC | PRN
  Filled 2019-08-02: qty 2

## 2019-08-02 MED ORDER — OXYCODONE-ACETAMINOPHEN 5-325 MG PO TABS: 1.0000 | ORAL_TABLET | ORAL | Status: DC | PRN

## 2019-08-02 MED ORDER — FENTANYL-BUPIVACAINE-NACL 0.5-0.125-0.9 MG/250ML-% EP SOLN
12.0000 mL/h | EPIDURAL | Status: DC | PRN
  Filled 2019-08-02: qty 250

## 2019-08-02 MED ORDER — PHENYLEPHRINE 40 MCG/ML (10ML) SYRINGE FOR IV PUSH (FOR BLOOD PRESSURE SUPPORT)
80.0000 ug | PREFILLED_SYRINGE | INTRAVENOUS | Status: DC | PRN
  Filled 2019-08-02: qty 10

## 2019-08-02 MED ORDER — TETANUS-DIPHTH-ACELL PERTUSSIS 5-2.5-18.5 LF-MCG/0.5 IM SUSP: 0.5000 mL | Freq: Once | INTRAMUSCULAR | Status: DC

## 2019-08-02 MED ORDER — FLEET ENEMA 7-19 GM/118ML RE ENEM: 1.0000 | ENEMA | RECTAL | Status: DC | PRN

## 2019-08-02 MED ORDER — PRENATAL MULTIVITAMIN CH
1.0000 | ORAL_TABLET | Freq: Every day | ORAL | Status: DC
  Administered 2019-08-03: 12:00:00 1 via ORAL
  Filled 2019-08-02: qty 1

## 2019-08-02 MED ORDER — FENTANYL-BUPIVACAINE-NACL 0.5-0.125-0.9 MG/250ML-% EP SOLN: 12.0000 mL/h | EPIDURAL | Status: DC | PRN

## 2019-08-02 MED ORDER — LACTATED RINGERS IV SOLN: 500.0000 mL | Freq: Once | INTRAVENOUS | Status: DC

## 2019-08-02 MED ORDER — ONDANSETRON HCL 4 MG PO TABS: 4.0000 mg | ORAL_TABLET | ORAL | Status: DC | PRN

## 2019-08-02 MED ORDER — LACTATED RINGERS IV SOLN: 500.0000 mL | Freq: Once | INTRAVENOUS | Status: AC

## 2019-08-02 MED ORDER — ACETAMINOPHEN 325 MG PO TABS
650.0000 mg | ORAL_TABLET | ORAL | Status: DC | PRN
  Administered 2019-08-02 – 2019-08-04 (×5): 650 mg via ORAL
  Filled 2019-08-02 (×5): qty 2

## 2019-08-02 MED ORDER — FENTANYL CITRATE (PF) 100 MCG/2ML IJ SOLN: 100.0000 ug | INTRAMUSCULAR | Status: DC | PRN

## 2019-08-02 MED ORDER — LIDOCAINE HCL (PF) 1 % IJ SOLN
INTRAMUSCULAR | Status: DC | PRN
  Administered 2019-08-02 (×2): 4 mL via EPIDURAL

## 2019-08-02 MED ORDER — ONDANSETRON HCL 4 MG/2ML IJ SOLN: 4.0000 mg | INTRAMUSCULAR | Status: DC | PRN

## 2019-08-02 MED ORDER — COCONUT OIL OIL: 1.0000 "application " | TOPICAL_OIL | Status: DC | PRN

## 2019-08-02 MED ORDER — ZOLPIDEM TARTRATE 5 MG PO TABS: 5.0000 mg | ORAL_TABLET | Freq: Every evening | ORAL | Status: DC | PRN

## 2019-08-02 MED ORDER — LACTATED RINGERS IV SOLN: 500.0000 mL | INTRAVENOUS | Status: DC | PRN

## 2019-08-02 MED ORDER — IBUPROFEN 600 MG PO TABS
600.0000 mg | ORAL_TABLET | Freq: Four times a day (QID) | ORAL | Status: DC
  Administered 2019-08-02 – 2019-08-04 (×6): 600 mg via ORAL
  Filled 2019-08-02 (×7): qty 1

## 2019-08-02 MED ORDER — LACTATED RINGERS IV SOLN
500.0000 mL | INTRAVENOUS | Status: DC | PRN
  Administered 2019-08-02: 500 mL via INTRAVENOUS

## 2019-08-02 MED ORDER — SIMETHICONE 80 MG PO CHEW: 80.0000 mg | CHEWABLE_TABLET | ORAL | Status: DC | PRN

## 2019-08-02 MED ORDER — SENNOSIDES-DOCUSATE SODIUM 8.6-50 MG PO TABS
2.0000 | ORAL_TABLET | ORAL | Status: DC
  Administered 2019-08-03 – 2019-08-04 (×2): 2 via ORAL
  Filled 2019-08-02 (×2): qty 2

## 2019-08-02 MED ORDER — SODIUM CHLORIDE (PF) 0.9 % IJ SOLN
INTRAMUSCULAR | Status: DC | PRN
  Administered 2019-08-02: 12 mL/h via EPIDURAL

## 2019-08-02 MED ORDER — BENZOCAINE-MENTHOL 20-0.5 % EX AERO
1.0000 "application " | INHALATION_SPRAY | CUTANEOUS | Status: DC | PRN
  Filled 2019-08-02: qty 56

## 2019-08-02 MED ORDER — DIPHENHYDRAMINE HCL 25 MG PO CAPS: 25.0000 mg | ORAL_CAPSULE | Freq: Four times a day (QID) | ORAL | Status: DC | PRN

## 2019-08-02 MED ORDER — LACTATED RINGERS IV SOLN: INTRAVENOUS | Status: DC

## 2019-08-02 MED ORDER — WITCH HAZEL-GLYCERIN EX PADS: 1.0000 "application " | MEDICATED_PAD | CUTANEOUS | Status: DC | PRN

## 2019-08-02 MED ORDER — OXYTOCIN BOLUS FROM INFUSION
500.0000 mL | Freq: Once | INTRAVENOUS | Status: AC
  Administered 2019-08-02: 500 mL via INTRAVENOUS

## 2019-08-02 NOTE — Discharge Summary (Signed)
Postpartum Discharge Summary      Patient Name: Tonya Martin DOB: 02/27/1996 MRN: [id number]  Date of admission: 08/02/2019 Delivering Provider: ELLERY, JOHN B   Date of discharge: 08/04/2019  Admitting diagnosis: 40 WKS, CTX Intrauterine pregnancy: [redacted]w[redacted]d     Secondary diagnosis:  Active Problems:   Supervision of normal pregnancy, antepartum   Normal labor   Post term pregnancy over 40 weeks   IUD (intrauterine device) in place  Additional problems:      Discharge diagnosis: Term Pregnancy Delivered                                                                                                Post partum procedures:Post-placental Liletta IUD placement  Augmentation: Pitocin  Complications: 1st degree periurethral laceration   Hospital course:  Onset of Labor With Vaginal Delivery     23 y.o. yo G1P0 at [redacted]w[redacted]d was admitted in Latent Labor on 08/02/2019. Patient had an uncomplicated labor course as follows: Arrived at 4cm and briefly augmented with pitocin which was discontinued due to decerlations. Subsequently rapidly progressed to fully dilated and then had unintentional AROM with SVE. Allowed to labor down for an hour and then had successful NSVD.  Membrane Rupture Time/Date: 11:09 AM ,08/02/2019   Intrapartum Procedures: Episiotomy: None [1]                                         Lacerations:  1st degree [2];Periurethral [8]  Patient had a delivery of a Viable infant. 08/02/2019  Information for the patient's newborn:  Gunther, Boy Taneka [[id number]]  Delivery Method: Vaginal, Spontaneous(Filed from Delivery Summary)     Pateint had an uncomplicated postpartum course.  She is ambulating, tolerating a regular diet, passing flatus, and urinating well. Patient is discharged home in stable condition on 08/04/19.  Delivery time: 1:36 PM    Magnesium Sulfate received: No BMZ received: No Rhophylac:N/A MMR:N/A Transfusion:No  Physical exam  Vitals:   08/03/19 0500  08/03/19 2102 08/03/19 2300 08/04/19 0554  BP: 132/82  134/76 123/71  Pulse: 75 73  68  Resp: 16 17 18 15  Temp: 97.8 F (36.6 C) 97.8 F (36.6 C) 98.3 F (36.8 C) 98.9 F (37.2 C)  TempSrc: Oral Oral Oral   SpO2: 100% 96%  100%  Weight:      Height:       General: alert Lochia: appropriate Uterine Fundus: soft Incision: N/A DVT Evaluation: No evidence of DVT seen on physical exam. Labs: Lab Results  Component Value Date   WBC 11.9 (H) 08/03/2019   HGB 12.0 08/03/2019   HCT 34.8 (L) 08/03/2019   MCV 86.8 08/03/2019   PLT 192 08/03/2019   CMP Latest Ref Rng & Units 08/02/2019  Glucose 70 - 99 mg/dL 97  BUN 6 - 20 mg/dL 11  Creatinine 0.44 - 1.00 mg/dL 0.62  Sodium 135 - 145 mmol/L 134(L)  Potassium 3.5 - 5.1 mmol/L 3.7  Chloride 98 - 111 mmol/L 104  CO2 22 - 32   mmol/L 19(L)  Calcium 8.9 - 10.3 mg/dL 9.1  Total Protein 6.5 - 8.1 g/dL 6.6  Total Bilirubin 0.3 - 1.2 mg/dL 0.3  Alkaline Phos 38 - 126 U/L 87  AST 15 - 41 U/L 20  ALT 0 - 44 U/L 15    Discharge instruction: per After Visit Summary and "Baby and Me Booklet".  After visit meds:  Allergies as of 08/04/2019      Reactions   Peanut-containing Drug Products Hives, Shortness Of Breath   Same reaction with chick peas.   Flonase [fluticasone Propionate] Other (See Comments)   Nasal burning      Medication List    STOP taking these medications   bacitracin-polymyxin b ophthalmic ointment Commonly known as: POLYSPORIN   Blood Pressure Kit   Comfort Fit Maternity Supp Sm Misc   omeprazole 20 MG capsule Commonly known as: PriLOSEC   terconazole 0.8 % vaginal cream Commonly known as: TERAZOL 3   TUMS E-X PO     TAKE these medications   ibuprofen 600 MG tablet Commonly known as: ADVIL Take 1 tablet (600 mg total) by mouth every 6 (six) hours.   Prenatal Vitamin 27-0.8 MG Tabs Take 1 tablet by mouth daily.       Diet: routine diet  Activity: Advance as tolerated. Pelvic rest for 6 weeks.    Outpatient follow up:4 weeks post partum, BP check 1 week Follow up Appt: Future Appointments  Date Time Provider Department Center  08/09/2019  1:15 PM [facility] NURSE [facility] None  08/30/2019 10:00 AM Constant, Peggy, MD [facility] None   Follow up Visit: Follow-up Information    [facility]. Go on 08/09/2019.   Specialty: Obstetrics and Gynecology Why: For postpartum blood pressure check at 1:15pm  Contact information: [address], [address] [place] [address] [phone]           Please schedule this patient for Postpartum visit in: 4 weeks with the following provider: Any provider For C/S patients schedule nurse incision check in weeks 2 weeks: no Low risk pregnancy complicated by: n/a Delivery mode:  SVD Anticipated Birth Control:  Post placental Liletta IUD placed PP Procedures needed: IUD string check  Schedule Integrated BH visit: no    Newborn Data: Live born female  Birth Weight:  3185 APGAR: 8, 9  Newborn Delivery   Birth date/time: 08/02/2019 13:36:00 Delivery type: Vaginal, Spontaneous      Baby Feeding: Breast Disposition:home with mother   Catherine Wallace, D.O. OB Family Medicine Fellow, [facility], [facility] 08/04/2019, 11:38 AM   

## 2019-08-02 NOTE — MAU Note (Signed)
Patient presents to MAU c/o ctx. Patient states ctx are stronger and she is feeling more pressure in butt and vagina.  Patient reports +FM, denies LOF or vaginal bleeding.

## 2019-08-02 NOTE — Progress Notes (Signed)
LABOR PROGRESS NOTE  Tonya Martin is a 23 y.o. G1P0 at [redacted]w[redacted]d  admitted for spontaneous labor, NRFHT, and new PIH.  Subjective: Feeling comfortable with epidural Denies headache, vision changes, chest pain, SOB  Objective: BP 140/82   Pulse 72   Temp 99.2 F (37.3 C) (Oral)   Resp 17   Ht 5\' 5"  (1.651 m)   Wt 96.5 kg   LMP 10/21/2018 (Approximate)   SpO2 98%   BMI 35.40 kg/m  or  Vitals:   08/02/19 0920 08/02/19 0926 08/02/19 0930 08/02/19 0937  BP: 130/78 128/76 131/79 140/82  Pulse: 77 82 75 72  Resp:  17    Temp:      TempSrc:      SpO2: 97% 97% 97% 98%  Weight:      Height:         Dilation: 4.5 Effacement (%): 90 Station: -2 Presentation: Vertex Exam by:: Dr. Dione Plover  FHT: baseline rate 135, moderate varibility, +acel, + early and variable decel Toco: q6 min  Labs: Lab Results  Component Value Date   WBC 9.7 08/02/2019   HGB 13.5 08/02/2019   HCT 39.3 08/02/2019   MCV 86.8 08/02/2019   PLT 255 08/02/2019    Patient Active Problem List   Diagnosis Date Noted  . Normal labor 08/02/2019  . Post term pregnancy over 40 weeks 08/02/2019  . Supervision of normal pregnancy, antepartum 01/19/2019  . Other seasonal allergic rhinitis 12/27/2015  . Adjustment disorder with mixed anxiety and depressed mood 04/13/2013  . Food allergy 04/13/2013    Assessment / Plan: 23 y.o. G1P0 at [redacted]w[redacted]d here for SOL, NRFHT, and new PIH.  Labor: start pitocin for augmentation given poor contraction pattern. Fetal head at -2 at rest but easily ballotable, defer AROM at this time.  Fetal Wellbeing:  Cat II for variable but reassuring with moderate variability and accels Pain Control:  epidural GBS: negative Anticipated MOD:  SVD  PIH: Asymptomatic at present. At minimum meeting criteria for gHTN with Bps>4hr apart. CBC and CMP unremarkable, UPCR pending  Augustin Coupe, MD/MP[facility] OB Fellow  08/02/2019, 9:38 AM

## 2019-08-02 NOTE — Anesthesia Preprocedure Evaluation (Signed)
Anesthesia Evaluation  Patient identified by MRN, date of birth, ID band Patient awake    Reviewed: Allergy & Precautions, Patient's Chart, lab work & pertinent test results  Airway Mallampati: II  TM Distance: >3 FB Neck ROM: Full    Dental no notable dental hx. (+) Teeth Intact   Pulmonary neg pulmonary ROS,    Pulmonary exam normal breath sounds clear to auscultation       Cardiovascular negative cardio ROS Normal cardiovascular exam Rhythm:Regular Rate:Normal     Neuro/Psych PSYCHIATRIC DISORDERS Anxiety Depression PTSDnegative neurological ROS     GI/Hepatic negative GI ROS, Neg liver ROS,   Endo/Other  Obesity  Renal/GU negative Renal ROS  negative genitourinary   Musculoskeletal negative musculoskeletal ROS (+)   Abdominal (+) + obese,   Peds  Hematology negative hematology ROS (+)   Anesthesia Other Findings   Reproductive/Obstetrics (+) Pregnancy                             Anesthesia Physical Anesthesia Plan  ASA: II  Anesthesia Plan:    Post-op Pain Management:    Induction:   PONV Risk Score and Plan:   Airway Management Planned: Natural Airway  Additional Equipment:   Intra-op Plan:   Post-operative Plan:   Informed Consent: I have reviewed the patients History and Physical, chart, labs and discussed the procedure including the risks, benefits and alternatives for the proposed anesthesia with the patient or authorized representative who has indicated his/her understanding and acceptance.       Plan Discussed with: Anesthesiologist  Anesthesia Plan Comments:         Anesthesia Quick Evaluation

## 2019-08-02 NOTE — Anesthesia Procedure Notes (Signed)
Epidural Patient location during procedure: OB Start time: 08/02/2019 8:55 AM End time: 08/02/2019 9:04 AM  Staffing Anesthesiologist: Josephine Igo, MD Performed: anesthesiologist   Preanesthetic Checklist Completed: patient identified, site marked, surgical consent, pre-op evaluation, timeout performed, IV checked, risks and benefits discussed and monitors and equipment checked  Epidural Patient position: sitting Prep: site prepped and draped and DuraPrep Patient monitoring: continuous pulse ox and blood pressure Approach: midline Location: L3-L4 Injection technique: LOR air  Needle:  Needle type: Tuohy  Needle gauge: 17 G Needle length: 9 cm and 9 Needle insertion depth: 7 cm Catheter type: closed end flexible Catheter size: 19 Gauge Catheter at skin depth: 12 cm Test dose: negative and Other  Assessment Events: blood not aspirated, injection not painful, no injection resistance, negative IV test and no paresthesia  Additional Notes Patient identified. Risks and benefits discussed including failed block, incomplete  Pain control, post dural puncture headache, nerve damage, paralysis, blood pressure Changes, nausea, vomiting, reactions to medications-both toxic and allergic and post Partum back pain. All questions were answered. Patient expressed understanding and wished to proceed. Sterile technique was used throughout procedure. Epidural site was Dressed with sterile barrier dressing. No paresthesias, signs of intravascular injection Or signs of intrathecal spread were encountered.  Patient was more comfortable after the epidural was dosed. Please see RN's note for documentation of vital signs and FHR which are stable. Reason for block:procedure for pain

## 2019-08-02 NOTE — Procedures (Signed)
Post-Placental IUD Insertion Procedure Note  Patient identified, informed consent signed prior to delivery, signed copy in chart, time out was performed.    Vaginal, labial and perineal areas thoroughly inspected for lacerations. 1st degree perineal and L periuretheral laceration identified - hemostatic, not repaired prior to insertion of Liletta IUD.  - IUD grasped between sterile gloved fingers. Sterile lubrication applied to sterile gloved hand for ease of insertion. Fundus identified through abdominal wall using non-insertion hand. IUD inserted to fundus with bimanual technique. IUD carefully released at the fundus and insertion hand gently removed from vagina.   Strings trimmed to the level of the introitus. Patient tolerated procedure well.  Lot # [id number] Expiration Date04/10/2022  Patient given post procedure instructions and IUD care card with expiration date.  Patient is asked to keep IUD strings tucked in her vagina until her postpartum follow up visit in 4-6 weeks. Patient advised to abstain from sexual intercourse and pulling on strings before her follow-up visit. Patient verbalized an understanding of the plan of care and agrees.

## 2019-08-02 NOTE — H&P (Signed)
OBSTETRIC ADMISSION HISTORY AND PHYSICAL  Tonya Martin is a 23 y.o. female G51P0 with IUP at 36w5dby LMP presenting for spontaneous onset of labor with new onset HTN and NRFHT. She reports +FMs, No LOF, no VB, no blurry vision, headaches or peripheral edema, and RUQ pain.  She plans on breast feeding. She request postplacental IUD for birth control. [facility] --->  Estimated Date of Delivery: 07/28/19  Prenatal History/Complications:  Past Medical History: Past Medical History:  Diagnosis Date  . Allergy   . Anxiety   . Depression   . Medical history non-contributory   . PTSD (post-traumatic stress disorder)   . Vision abnormalities     Past Surgical History: Past Surgical History:  Procedure Laterality Date  . NO PAST SURGERIES      Obstetrical History: OB History    Gravida  1   Para      Term      Preterm      AB      Living        SAB      TAB      Ectopic      Multiple      Live Births              Social History Social History   Socioeconomic History  . Marital status: Single    Spouse name: Not on file  . Number of children: Not on file  . Years of education: Not on file  . Highest education level: Not on file  Occupational History  . Not on file  Social Needs  . Financial resource strain: Not on file  . Food insecurity    Worry: Not on file    Inability: Not on file  . Transportation needs    Medical: Not on file    Non-medical: Not on file  Tobacco Use  . Smoking status: Never Smoker  . Smokeless tobacco: Never Used  . Tobacco comment: marijuana   Substance and Sexual Activity  . Alcohol use: Never    Alcohol/week: 0.0 standard drinks    Frequency: Never  . Drug use: Not Currently    Types: Marijuana    Comment: "about 3 weeks ago" as of 10/21  . Sexual activity: Not Currently    Partners: Male    Comment: Pregnant   Lifestyle  . Physical activity    Days per week: Not on  file    Minutes per session: Not on file  . Stress: Not on file  Relationships  . Social [place] phone: Not on file    Gets together: Not on file    Attends religious service: Not on file    Active member of club or organization: Not on file    Attends meetings of clubs or organizations: Not on file    Relationship status: Not on file  Other Topics Concern  . Not on file  Social History Narrative   Lives in foster care with a friend of her deceased mother's     Family History: Family History  Problem Relation Age of Onset  . Cancer Mother   . Diabetes Mother   . Hyperlipidemia Mother   . Hypertension Mother     Allergies: Allergies  Allergen Reactions  . Peanut-Containing Drug Products Hives and Shortness Of Breath    Same reaction with chick peas.  .Asencion Islam[Fluticasone Propionate] Other (See  Comments)    Nasal burning    Medications Prior to Admission  Medication Sig Dispense Refill Last Dose  . bacitracin-polymyxin b (POLYSPORIN) ophthalmic ointment Place 1 application into both eyes 2 (two) times daily. apply to eye every 12 hours while awake (Patient not taking: Reported on 05/04/2019) 3.5 g 0   . Blood Pressure KIT Monitor BP weekly 1 each 0   . Calcium Carbonate Antacid (TUMS E-X PO) Take by mouth.     Regino Schultze Bandages & Supports (COMFORT FIT MATERNITY SUPP SM) MISC Wear as directed. (Patient not taking: Reported on 07/13/2019) 1 each 0   . omeprazole (PRILOSEC) 20 MG capsule Take 1 capsule (20 mg total) by mouth 2 (two) times daily before a meal. (Patient not taking: Reported on 06/16/2019) 60 capsule 5   . Prenatal Vit-Fe Fumarate-FA (PRENATAL VITAMIN) 27-0.8 MG TABS Take 1 tablet by mouth daily. 30 tablet 11   . terconazole (TERAZOL 3) 0.8 % vaginal cream Place 1 applicator vaginally at bedtime. Apply nightly for three nights. 20 g 0      Review of Systems   All systems reviewed and negative except as stated in HPI  Blood pressure 140/87,  pulse 77, temperature 99.2 F (37.3 C), temperature source Oral, resp. rate 18, height _0  (1.651 m), weight 96.5 kg, last menstrual period 10/21/2018. General appearance: alert, cooperative and no distress Lungs: clear to auscultation bilaterally Heart: regular rate and rhythm Abdomen: soft, non-tender; bowel sounds normal Pelvic: n/a Extremities: Homans sign is negative, no sign of DVT DTR's +2 Presentation: cephalic Fetal monitoringBaseline: 140 bpm, Variability: Good {> 6 bpm), Accelerations: Reactive and Decelerations: Early and variables Uterine activityFrequency: Every 4-6 minutes Dilation: 4 Effacement (%): 90 Station: -1 Exam by:: Bridgette Habermann, RN   Prenatal labs: ABO, Rh: --/--/O POS (10/26 0619) Antibody: NEG (10/26 7672) Rubella: 3.36 (04/14 1304) RPR: Non Reactive (07/28 0946)  HBsAg: Negative (04/14 1304)  HIV: Non Reactive (07/28 0946)  GBS: --Henderson Cloud (09/22 1156)    Prenatal Transfer Tool  Maternal Diabetes: No Genetic Screening: Normal Maternal Ultrasounds/Referrals: Normal Fetal Ultrasounds or other Referrals:  Referred to Materal Fetal Medicine  Maternal Substance Abuse:  No Significant Maternal Medications:  None Significant Maternal Lab Results: Group B Strep negative  Results for orders placed or performed during the hospital encounter of 08/02/19 (from the past 24 hour(s))  Comprehensive metabolic panel   Collection Time: 08/02/19  6:17 AM  Result Value Ref Range   Sodium 134 (L) 135 - 145 mmol/L   Potassium 3.7 3.5 - 5.1 mmol/L   Chloride 104 98 - 111 mmol/L   CO2 19 (L) 22 - 32 mmol/L   Glucose, Bld 97 70 - 99 mg/dL   BUN 11 6 - 20 mg/dL   Creatinine, Ser 0.62 0.44 - 1.00 mg/dL   Calcium 9.1 8.9 - 10.3 mg/dL   Total Protein 6.6 6.5 - 8.1 g/dL   Albumin 3.3 (L) 3.5 - 5.0 g/dL   AST 20 15 - 41 U/L   ALT 15 0 - 44 U/L   Alkaline Phosphatase 87 38 - 126 U/L   Total Bilirubin 0.3 0.3 - 1.2 mg/dL   GFR calc non Af Amer >60 >60 mL/min   GFR  calc Af Amer >60 >60 mL/min   Anion gap 11 5 - 15  Type and screen [place]   Collection Time: 08/02/19  6:19 AM  Result Value Ref Range   ABO/RH(D) O POS    Antibody  Screen NEG    Sample Expiration      08/05/2019,2359 Performed at [facility], [place] [address], [place], [place] [address]   SARS Coronavirus 2 by RT PCR (hospital order, performed in [facility] hospital lab) Nasopharyngeal Nasopharyngeal Swab   Collection Time: 08/02/19  6:22 AM   Specimen: Nasopharyngeal Swab  Result Value Ref Range   SARS Coronavirus 2 NEGATIVE NEGATIVE    Patient Active Problem List   Diagnosis Date Noted  . Normal labor 08/02/2019  . Post term pregnancy over 40 weeks 08/02/2019  . Supervision of normal pregnancy, antepartum 01/19/2019  . Other seasonal allergic rhinitis 12/27/2015  . Adjustment disorder with mixed anxiety and depressed mood 04/13/2013  . Food allergy 04/13/2013    Assessment/Plan:  Tonya Martin is a 23 y.o. G1P0 at 68w5dhere for spontaneous onset of labor, new onset HTN, NRFHT  #Labor: expectant management for now. Preeclampsia labs pending.  #Pain: Planning epidural #FWB: Cat 1 since admission, occasional variables #ID:  GBS neg #MOF: Breast #MOC: postplacental IUD #Circ:  outpatient  CWende Mott CNM  08/02/2019, 8:04 AM

## 2019-08-03 LAB — CBC
HCT: 34.8 % — ABNORMAL LOW (ref 36.0–46.0)
Hemoglobin: 12 g/dL (ref 12.0–15.0)
MCH: 29.9 pg (ref 26.0–34.0)
MCHC: 34.5 g/dL (ref 30.0–36.0)
MCV: 86.8 fL (ref 80.0–100.0)
Platelets: 192 10*3/uL (ref 150–400)
RBC: 4.01 MIL/uL (ref 3.87–5.11)
RDW: 12.6 % (ref 11.5–15.5)
WBC: 11.9 10*3/uL — ABNORMAL HIGH (ref 4.0–10.5)
nRBC: 0 % (ref 0.0–0.2)

## 2019-08-03 NOTE — Lactation Note (Signed)
This note was copied from a baby's chart. Lactation Consultation Note  Patient Name: Tonya Martin [id number] Date: 08/03/2019 Reason for consult: Initial assessment;1st time breastfeeding;Term P1, 16 hour female infant, -4% weight loss. Per mom, she has DEBP at home. Mom feels breastfeeding is going well this is her 7th time latching infant to breast. Mom taught back hand expression and infant received 2 ml of colostrum by spoon.  [place] notice mom is slightly flat but breast responses well with stimulation, mom was given breast shells to wear in bra during the day. Mom latched infant on left breast using the cross cradle hold, infant latched with wide gap and nose and chin touching breast. Infant was still breastfeeding after 13 minutes when [place] left room.  Mom knows to breastfeed according hunger cues, 8 to 12 times within 24 hours and on demand. Mom knows to call Nurse or [place] if she has any questions, concerns or need assistance with latching infant to breast. Mom was concerned with infant's weight loss, lC discussed infant's can have weight loss within first week. Mom can look at infant stools and voids, and she can give infant EBM  On spoon after latching infant to breast by hand expression.   Reviewed Baby & Me book's Breastfeeding Basics.  Mom made aware of O/P services, breastfeeding support groups, community resources, and our phone # for post-discharge questions.   Maternal Data Formula Feeding for Exclusion: No Has patient been taught Hand Expression?: Yes(Infant was given 52ml of colostrum by spoon.) Does the patient have breastfeeding experience prior to this delivery?: No  Feeding Feeding Type: Breast Fed  LATCH Score Latch: Grasps breast easily, tongue down, lips flanged, rhythmical sucking.  Audible Swallowing: Spontaneous and intermittent  Type of Nipple: Everted at rest and after stimulation  Comfort (Breast/Nipple): Soft / non-tender  Hold (Positioning): Assistance  needed to correctly position infant at breast and maintain latch.  LATCH Score: 9  Interventions Interventions: Breast feeding basics reviewed;Breast compression;Adjust position;Assisted with latch;Shells;Skin to skin;Support pillows;Breast massage;Position options;Hand express  Lactation Tools Discussed/Used WIC Program: No   Consult Status Consult Status: Follow-up Date: 08/03/19 Follow-up type: In-patient    Vicente Serene 08/03/2019, 6:08 AM

## 2019-08-03 NOTE — Progress Notes (Signed)
POSTPARTUM PROGRESS NOTE  Post Partum Day 1  Subjective:  Tonya Martin is a 23 y.o. G1P1001 s/p SVD at [redacted]w[redacted]d.  No acute events overnight.  Pt denies problems with ambulating, voiding or po intake.  She denies nausea or vomiting.  Pain is moderately controlled.  She has had flatus. She has not had bowel movement.  Lochia Moderate.   Objective: Blood pressure 132/82, pulse 75, temperature 97.8 F (36.6 C), temperature source Oral, resp. rate 16, height 5\' 5"  (1.651 m), weight 96.5 kg, last menstrual period 10/21/2018, SpO2 100 %, unknown if currently breastfeeding.  Physical Exam:  General: alert, cooperative and no distress Chest: no respiratory distress Heart:regular rate, distal pulses intact Abdomen: soft, nontender,  Uterine Fundus: firm, appropriately tender DVT Evaluation: No calf swelling or tenderness Extremities: no edema Skin: warm, dry  Recent Labs    08/02/19 1428 08/03/19 0545  HGB 12.7 12.0  HCT 36.9 34.8*    Assessment/Plan: Maylen Waltermire is a 23 y.o. G1P1001 s/p SVD at [redacted]w[redacted]d   PPD#1 - Doing well Contraception: PP IUD placed after delivery  Feeding: breast Dispo: Plan for discharge tomorrow.   LOS: 1 day   Lajean Manes, CNM 08/03/2019, 7:59 AM

## 2019-08-03 NOTE — Lactation Note (Signed)
This note was copied from a baby's chart. Lactation Consultation Note  Patient Name: Tonya Martin ENIDP'O Date: 08/03/2019 Reason for consult: Term;Primapara;1st time breastfeeding  P1 mother whose infant is now 62 hours old.    Baby was awake and ready to feed when I arrived.  Offered to assist with latching and mother agreeable.    Mother's breasts are soft and non tender and nipples are everted and intact.  Right nipple is short shafted but breast tissue is compressible.  Asked mother to return demonstrate hand expression.  Corrected her technique and she was able to express a couple of drops.  She is tense when performing hand expression; reminded her to take her time and be patient.  Finger fed the drops back to baby.    Assisted baby to latch to the left breast in the football hold easily.  Mother somewhat unsure of her ability to breast feed and I reassured her that breast feeding takes time and practice.  Demonstrated breast compressions and gentle stimulation and baby began sucking.  After a few minutes audible swallows noted.  Mother was excited to hear him swallowing.  Continued to discuss breast feeding basics while observing him feed for 13 minutes prior to my departure.  Baby becoming drowsy as I left and upper body very relaxed.  Asked mother to place him STS when he is finished feeding.  Mother happy to see him feeding.  Encouraged to continue feeding 8-12 times/24 hours or sooner if he shows feeding cues.  She will awaken at 4 hours if he remains sleepy.  Suggested she call her RN/LC for latch assistance as needed.    Mother has a DEBP for home use.  She will be a "stay at home" mother.  Father present.  RN updated.   Maternal Data Formula Feeding for Exclusion: No Has patient been taught Hand Expression?: Yes Does the patient have breastfeeding experience prior to this delivery?: No  Feeding Feeding Type: Breast Fed  LATCH Score Latch: Grasps breast easily, tongue  down, lips flanged, rhythmical sucking.  Audible Swallowing: Spontaneous and intermittent  Type of Nipple: Everted at rest and after stimulation  Comfort (Breast/Nipple): Soft / non-tender  Hold (Positioning): Assistance needed to correctly position infant at breast and maintain latch.  LATCH Score: 9  Interventions Interventions: Breast feeding basics reviewed;Assisted with latch;Skin to skin;Breast massage;Hand express;Breast compression;Adjust position;Position options;Support pillows  Lactation Tools Discussed/Used WIC Program: No   Consult Status Consult Status: Follow-up Date: 08/04/19 Follow-up type: In-patient    Annalysia Willenbring R Tyresha Fede 08/03/2019, 6:08 PM

## 2019-08-03 NOTE — Anesthesia Postprocedure Evaluation (Signed)
Anesthesia Post Note  Patient: Tonya Martin  Procedure(s) Performed: AN AD [place]     Patient location during evaluation: Mother Baby Anesthesia Type: Epidural Level of consciousness: awake and alert Pain management: pain level controlled Vital Signs Assessment: post-procedure vital signs reviewed and stable Respiratory status: spontaneous breathing, nonlabored ventilation and respiratory function stable Cardiovascular status: stable Postop Assessment: no headache, no backache and epidural receding Anesthetic complications: no    Last Vitals:  Vitals:   08/03/19 0100 08/03/19 0500  BP: 128/72 132/82  Pulse: 67 75  Resp: 16 16  Temp: 37 C 36.6 C  SpO2: 100% 100%    Last Pain:  Vitals:   08/03/19 0500  TempSrc: Oral  PainSc: 0-No pain   Pain Goal:                   Decarla Siemen

## 2019-08-03 NOTE — Clinical Social Work Maternal (Signed)
CLINICAL SOCIAL WORK MATERNAL/CHILD NOTE  Patient Details  Name: Tonya Martin MRN: [id number] Date of Birth: 08/29/1996  Date:  08/03/2019  Clinical Social Worker Initiating Note:  Broughton Eppinger Date/Time: Initiated:  08/03/19/0900     Child's Name:  Tonya Martin   Biological Parents:  Mother, Father(Tonya Martin and Tonya Martin DOB: 10/26/1995)   Need for Interpreter:  None   Reason for Referral:  Behavioral Health Concerns, Current Substance Use/Substance Use During Pregnancy    Address:  [address] [place] [address]    Phone number:  [phone] (home)     Additional phone number:   Household Members/Support Persons (HM/SP):   Household Member/Support Person 1, Household Member/Support Person 2, Household Member/Support Person 3   HM/SP Name Relationship DOB or Age  HM/SP -1   FOB    HM/SP -2   FOB's grandfather    HM/SP -3   FOB's grandmother    HM/SP -4        HM/SP -5        HM/SP -6        HM/SP -7        HM/SP -8          Natural Supports (not living in the home):  (FOB, fostermom, granddad)   Professional Supports:     Employment: Unemployed   Type of Work:     Education:  Some College   Homebound arranged:    Financial Resources:  Medicaid   Other Resources:  Food Stamps    Cultural/Religious Considerations Which May Impact Care:    Strengths:  Ability to meet basic needs , Home prepared for child , Pediatrician chosen   Psychotropic Medications:         Pediatrician:    ([facility])  Pediatrician List:   [place]    [place]    [place]    [place]    [place]    [place]      Pediatrician Fax Number:    Risk Factors/Current Problems:      Cognitive State:  Able to Concentrate , Alert , Linear Thinking    Mood/Affect:  Calm , Comfortable , Interested , Happy , Relaxed    CSW Assessment:  CSW received consult for history of anxiety, depression, PTSD and  for THC use.  CSW met with [name] to offer support and complete assessment.    [name] resting in bed breastfeeding infant with FOB present at bedside, when CSW entered the room. CSW introduced self and received verbal permission from [name] to have FOB step out of the room while CSW completed assessment. FOB understanding and left voluntarily. CSW explained reason for consult to which [name] was understanding. [name] very pleasant and engaged throughout assessment and was appropriate with and attentive to infant. [name] stated she currently lives in [place] but that she, FOB and infant are moving in with FOB's grandparents in [place], [place]. [name] confirmed she receives food stamps and [name] stated if [name] plans to stay in [place] for a while, [name] would need to transfer her food stamps. [name] aware of this. CSW inquired about [name]'s mental health history and [name] acknowledged history of anxiety and depression starting in 2014 and acknowledged some anxiety during pregnancy. [name] stated when symptoms came up during pregnancy they were primarily around delivery but [name] reported she was able to listen to music and relax her nerves. [name] reported last use of medications to help manage symptoms was in 2016 and that   the last time [name] participated in therapy was in 2019. [name] voiced interest in getting re-established in counseling to which CSW provided [name] with resources in [place]. CSW provided education regarding the baby blues period vs. perinatal mood disorders, discussed treatment and gave resources for mental health follow up if concerns arise.  CSW recommends self-evaluation during the postpartum time period using the New Mom Checklist from Postpartum Progress and encouraged [name] to contact a medical professional if symptoms are noted at any time. [name] did not appear to be displaying any acute mental health symptoms and denied any current SI, HI or DV. [name] reported having good support from FOB, her foster mother, and her granddad.    CSW inquired about [name]'s substance use during pregnancy and [name] acknowledged using marijuana a couple of weeks ago. Per [name], she "took a couple of hits" to calm her nerves but knew what she did was wrong. CSW informed [name] of Hospital Drug Policy and explained UDS came back negative but that CDS was still pending and a CPS report would be made, if warranted. [name] appeared concerned and CSW explained what process may look like. [name] denied any further questions or concerns regarding policy or possible report.   [name] confirmed having all essential items for infant once discharged and reported infant would be sleeping in a bassinet once home. CSW provided review of Sudden Infant Death Syndrome (SIDS) precautions and safe sleeping habits.     CSW Plan/Description:  No Further Intervention Required/No Barriers to Discharge, Sudden Infant Death Syndrome (SIDS) Education, Perinatal Mood and Anxiety Disorder (PMADs) Education, Hospital Drug Screen Policy Information, CSW Will Continue to Monitor Umbilical Cord Tissue Drug Screen Results and Make Report if Warranted    Addisen Chappelle, LCSWA 08/03/2019, 9:52 AM  

## 2019-08-04 ENCOUNTER — Inpatient Hospital Stay (HOSPITAL_COMMUNITY): Admission: AD | Admit: 2019-08-04 | Payer: Medicaid Other | Source: Home / Self Care | Admitting: Family Medicine

## 2019-08-04 ENCOUNTER — Inpatient Hospital Stay (HOSPITAL_COMMUNITY): Payer: Medicaid Other

## 2019-08-04 MED ORDER — IBUPROFEN 600 MG PO TABS: 600.0000 mg | ORAL_TABLET | Freq: Four times a day (QID) | ORAL | 0 refills | Status: DC

## 2019-08-04 NOTE — Lactation Note (Signed)
This note was copied from a baby's chart. Lactation Consultation Note  Patient Name: Tonya Martin FFMBW'G Date: 08/04/2019 Reason for consult: Follow-up assessment;Term;Primapara;1st time breastfeeding  LC in to visit with P84 Mom of term baby on day of discharge   Baby at 5% weight loss at 44 hrs old.   Baby latched to the left breast in football hold. Baby latched well with nutritive sucks and swallows identified.  Baby had been on for 30 mins.  Reviewed breast massage and hand expression, colostrum drops easily expressed.  Encouraged continued STS and feeding baby often with cues.   Engorgement prevention and treatment reviewed.  Basics reviewed. Mom aware of OP lactation support and encouraged to call prn.   Feeding Feeding Type: Breast Fed  LATCH Score Latch: Grasps breast easily, tongue down, lips flanged, rhythmical sucking.  Audible Swallowing: Spontaneous and intermittent  Type of Nipple: Everted at rest and after stimulation  Comfort (Breast/Nipple): Soft / non-tender  Hold (Positioning): No assistance needed to correctly position infant at breast.  LATCH Score: 10  Interventions Interventions: Support pillows;Skin to skin;Breast massage;Hand express   Consult Status Consult Status: Complete Date: 08/04/19 Follow-up type: Call as needed    Broadus John 08/04/2019, 10:35 AM

## 2019-08-04 NOTE — Discharge Instructions (Signed)

## 2019-08-09 ENCOUNTER — Ambulatory Visit: Payer: Medicaid Other

## 2019-08-12 ENCOUNTER — Ambulatory Visit: Payer: Medicaid Other

## 2019-08-30 ENCOUNTER — Encounter: Payer: Self-pay | Admitting: Obstetrics and Gynecology

## 2019-08-30 ENCOUNTER — Other Ambulatory Visit: Payer: Self-pay

## 2019-08-30 ENCOUNTER — Ambulatory Visit (INDEPENDENT_AMBULATORY_CARE_PROVIDER_SITE_OTHER): Payer: Medicaid Other | Admitting: Obstetrics and Gynecology

## 2019-08-30 DIAGNOSIS — Z1389 Encounter for screening for other disorder: Secondary | ICD-10-CM

## 2019-08-30 NOTE — Progress Notes (Signed)
Subjective:     Tonya Martin is a 23 y.o. female who presents for a postpartum visit. She is 4 weeks postpartum following a spontaneous vaginal delivery. I have fully reviewed the prenatal and intrapartum course. The delivery was at 40.5 gestational weeks. Outcome: spontaneous vaginal delivery. Anesthesia: epidural. Postpartum course has been Unremarkable. Baby's course has been Unremarkable. Baby is feeding by breast. Bleeding thin lochia. Bowel function is normal. Bladder function is normal. Patient is not sexually active. Contraception method is IUD. Postpartum depression screening: negative.     Review of Systems Pertinent items are noted in HPI.   Objective:    BP 132/79   Pulse 71   Ht 5\' 5"  (1.651 m)   Wt 186 lb (84.4 kg)   Breastfeeding Yes   BMI 30.95 kg/m   General:  alert, cooperative and no distress   Breasts:  inspection negative, no nipple discharge or bleeding, no masses or nodularity palpable  Lungs: clear to auscultation bilaterally  Heart:  regular rate and rhythm  Abdomen: soft, non-tender; bowel sounds normal; no masses,  no organomegaly   Vulva:  normal  Vagina: normal vagina, no discharge, exudate, lesion, or erythema  Cervix:  multiparous appearance, IUD strings visualized at the os  Corpus: normal size, contour, position, consistency, mobility, non-tender  Adnexa:  normal adnexa and no mass, fullness, tenderness  Rectal Exam: Not performed.        Assessment:     Normal postpartum exam. Pap smear not done at today's visit due to vaginal bleeding.   Plan:    1. Contraception: IUD 2. Patient is medically cleared to resume all activities of daily living 3. Follow up in: 6 months for annual exam with pap smear or as needed. Patient relocated to [facility] and will likely establish GYN care there.

## 2020-02-18 ENCOUNTER — Emergency Department (HOSPITAL_COMMUNITY)
Admission: EM | Admit: 2020-02-18 | Discharge: 2020-02-18 | Disposition: A | Discharge status: Home / Self Care | Payer: Medicaid Other | Attending: Emergency Medicine | Admitting: Emergency Medicine

## 2020-02-18 ENCOUNTER — Encounter (HOSPITAL_COMMUNITY): Payer: Self-pay

## 2020-02-18 ENCOUNTER — Other Ambulatory Visit: Payer: Self-pay

## 2020-02-18 DIAGNOSIS — W2211XA Striking against or struck by driver side automobile airbag, initial encounter: Secondary | ICD-10-CM | POA: Insufficient documentation

## 2020-02-18 DIAGNOSIS — Y9389 Activity, other specified: Secondary | ICD-10-CM | POA: Diagnosis not present

## 2020-02-18 DIAGNOSIS — Y999 Unspecified external cause status: Secondary | ICD-10-CM | POA: Diagnosis not present

## 2020-02-18 DIAGNOSIS — Y92410 Unspecified street and highway as the place of occurrence of the external cause: Secondary | ICD-10-CM | POA: Insufficient documentation

## 2020-02-18 DIAGNOSIS — R0789 Other chest pain: Secondary | ICD-10-CM | POA: Insufficient documentation

## 2020-02-18 MED ORDER — IBUPROFEN 600 MG PO TABS: 600.0000 mg | ORAL_TABLET | Freq: Four times a day (QID) | ORAL | 0 refills | Status: AC | PRN
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
## 2020-02-18 NOTE — ED Notes (Signed)
Patient verbalizes understanding of discharge instructions. Opportunity for questioning and answers were provided. Armband removed by staff, pt discharged from ED to home 
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
## 2020-02-21 ENCOUNTER — Encounter: Payer: Self-pay | Admitting: Pediatrics

## 2020-03-17 IMAGING — US US MFM UA CORD DOPPLER
1 series · 14 of 28 positions shown · non-contrast
Comparison: none

[Series 1: us mfm ua cord doppler · 52 acquisitions, 14 frames shown]
[im 2/52]
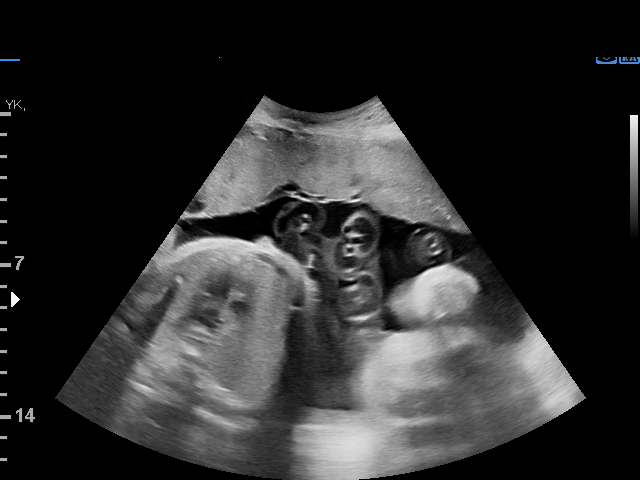
[im 6/52]
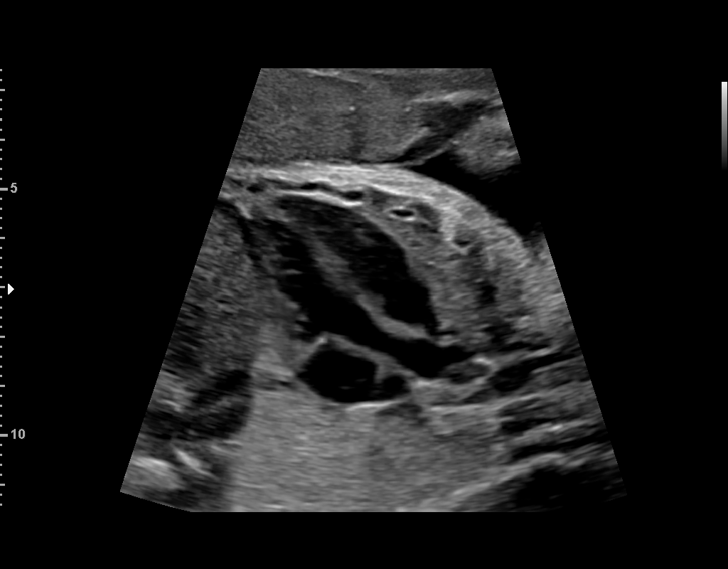
[im 10/52]
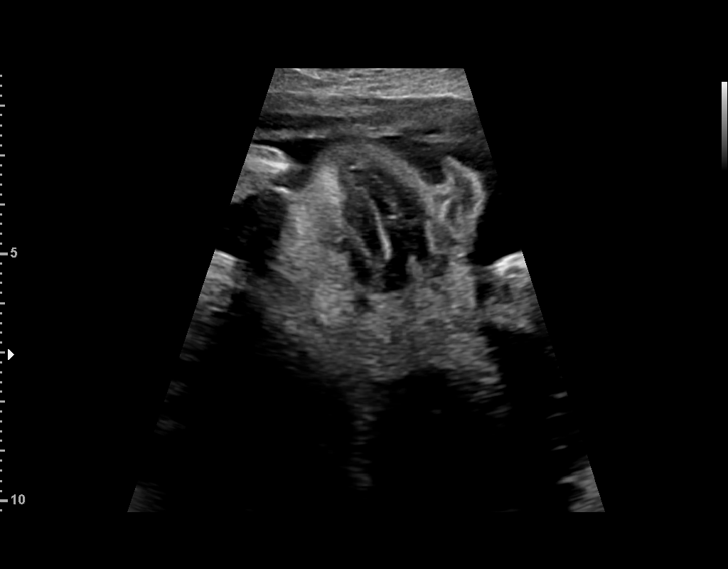
[im 14/52]
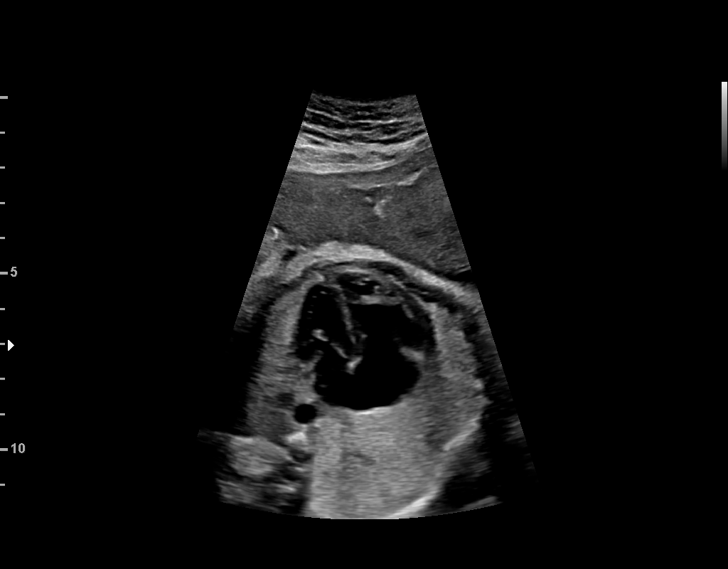
[im 18/52]
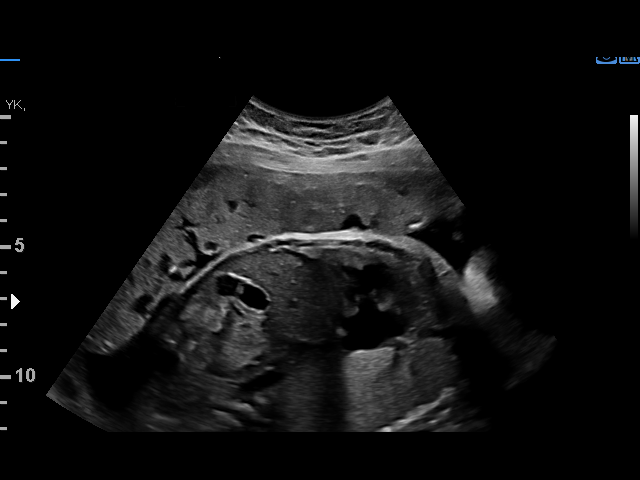
[im 21/52]
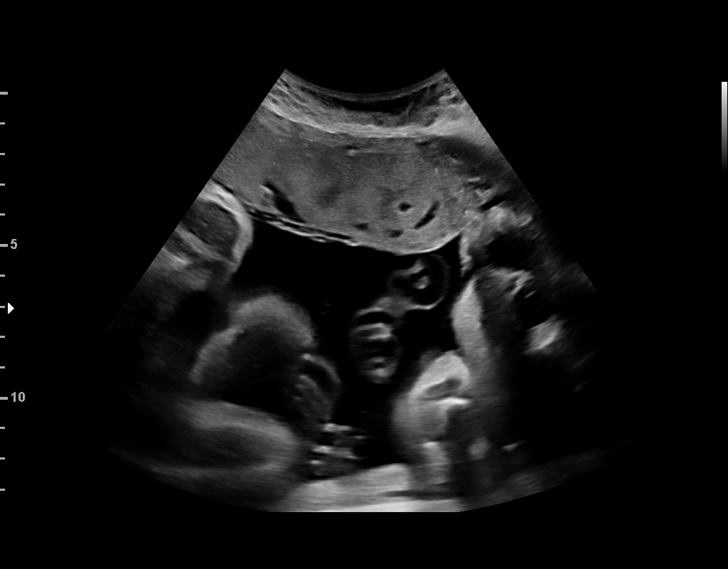
[im 25/52]
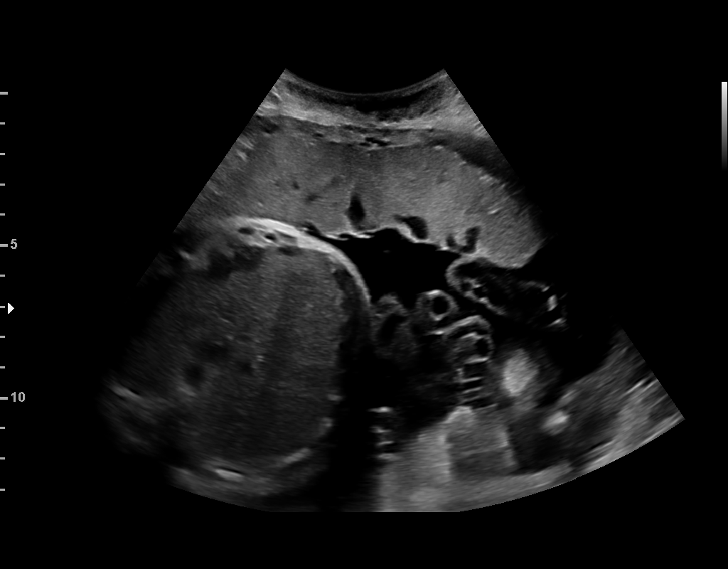
[im 29/52]
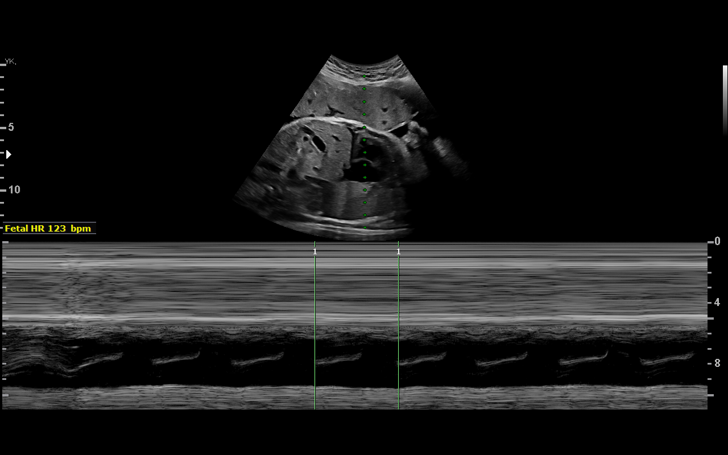
[im 33/52]
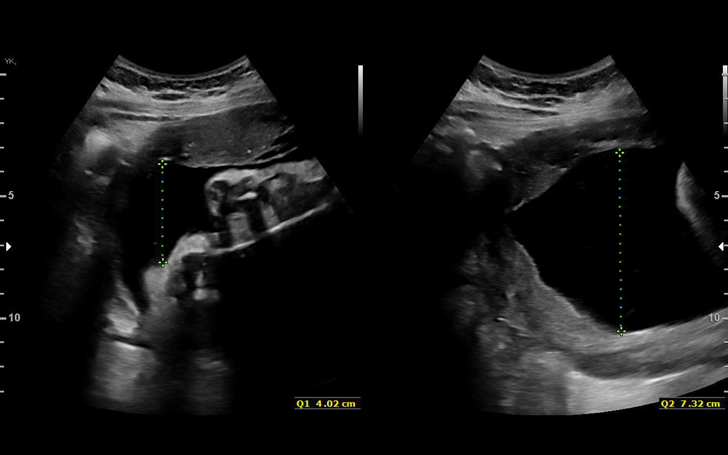
[im 36/52]
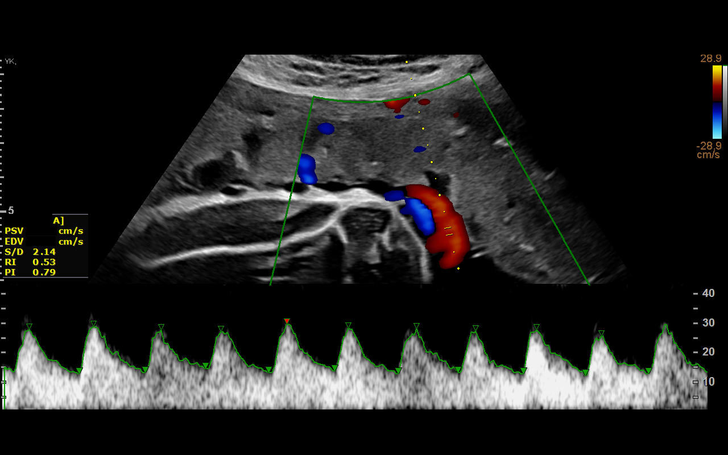
[im 40/52]
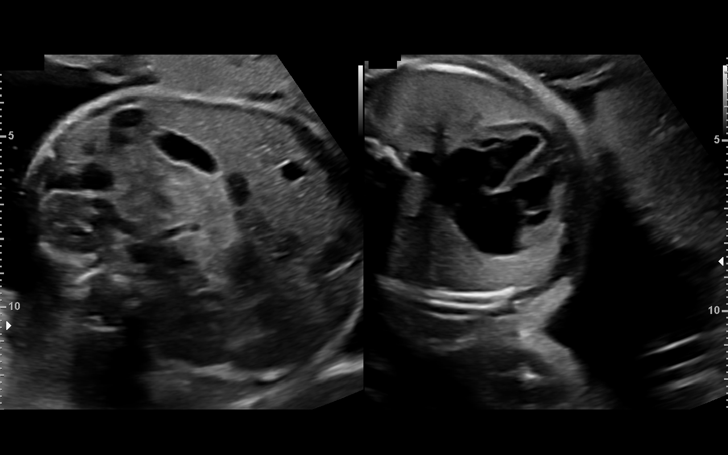
[im 44/52]
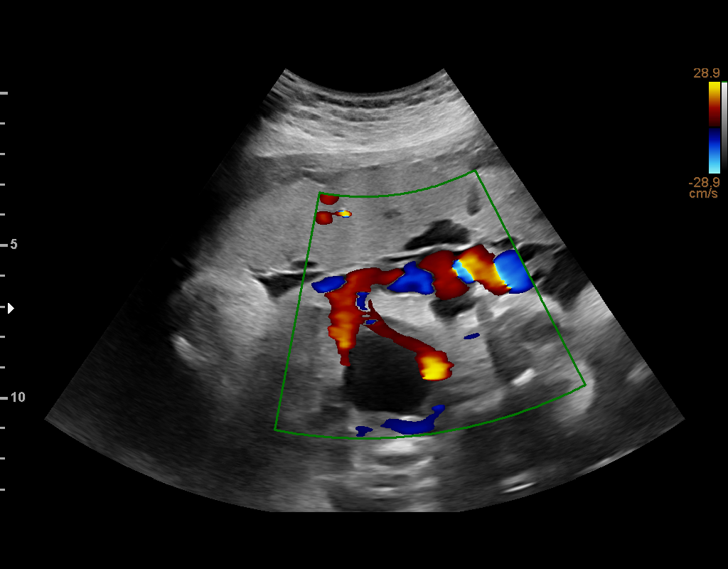
[im 48/52]
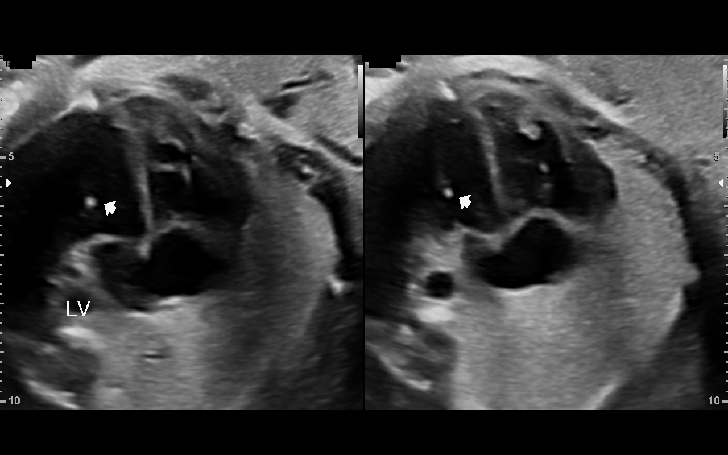
[im 52/52]
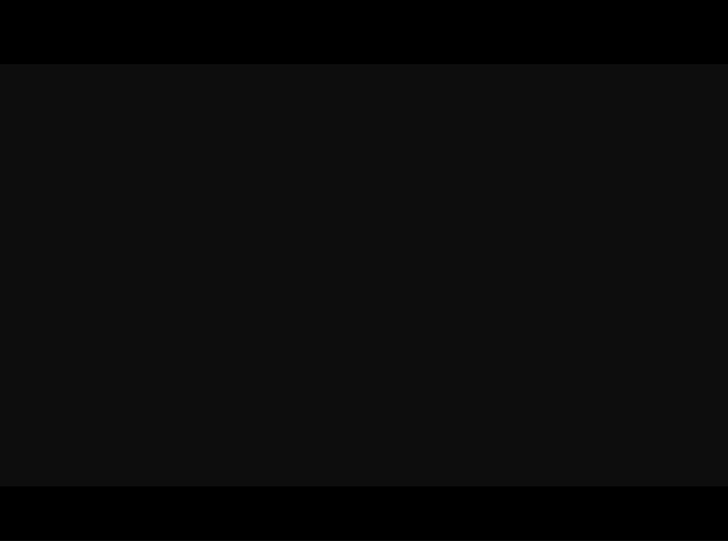

[14 of 28 positions shown; findings below may reference images not displayed]

1  US MFM UA CORD DOPPLER               76820.02     MOATSHE
                                                       JD
     STRESS                                            JD
 ----------------------------------------------------------------------

 ----------------------------------------------------------------------
Indications

  Small for gestational age fetus affecting
  management of mother
  Encounter for other antenatal screening
  follow-up
  Fetal abnormality - other known or
  suspected (EIF, pyelectasis)
  35 weeks gestation of pregnancy
 ----------------------------------------------------------------------
Vital Signs

                                                Height:        5'5"
Fetal Evaluation

 Num Of Fetuses:          1
 Fetal Heart Rate(bpm):   122
 Cardiac Activity:        Observed
 Presentation:            Cephalic
 Placenta:                Anterior
 P. Cord Insertion:       Visualized, central

 Amniotic Fluid
 AFI FV:      Within normal limits

 AFI Sum(cm)     %Tile       Largest Pocket(cm)
 21.45           81

 RUQ(cm)       RLQ(cm)       LUQ(cm)        LLQ(cm)

Biophysical Evaluation

 Amniotic F.V:   Within normal limits       F. Tone:         Observed
 F. Movement:    Observed                   Score:           [DATE]
 F. Breathing:   Observed
OB History

 Gravidity:    1
 Living:       0
Gestational Age

 LMP:           35w 6d        Date:  10/21/18                 EDD:   07/28/19
 Best:          35w 6d     Det. By:  LMP  (10/21/18)          EDD:   07/28/19
Anatomy

 Cranium:               Appears normal         Ductal Arch:            Appears normal
 Ventricles:            Appears normal         Abdominal Wall:         Appears nml (cord
                                                                       insert, abd wall)
 Lips:                  Appears normal         Cord Vessels:           Appears normal (3
                                                                       vessel cord)
 Heart:                 Echogenic focus        Kidneys:                Appear normal
                        in LV, RV
 RVOT:                  Appears normal         Bladder:                Appears normal
 LVOT:                  Appears normal
Doppler - Fetal Vessels

 Umbilical Artery
  S/D     %tile     RI              PI
 2.05       25

Cervix Uterus Adnexa

 Cervix
 Normal appearance by transabdominal scan.
Impression

 Known IUGR prior exam EFW 4% with AC 5th%
 Normal UA Dopplers and Biophysical profile today.
 EIF again seen
Recommendations

 Continue weekly BPP and UA dopplers
 Repeat growth scheduled on [DATE].

## 2020-03-25 IMAGING — US US MFM FETAL BPP W/O NON-STRESS
1 series · 13 of 28 positions shown · non-contrast
Comparison: none

[Series 1: us mfm fetal bpp w/o non-stress · 99 acquisitions, 13 frames shown]
[im 4/99]
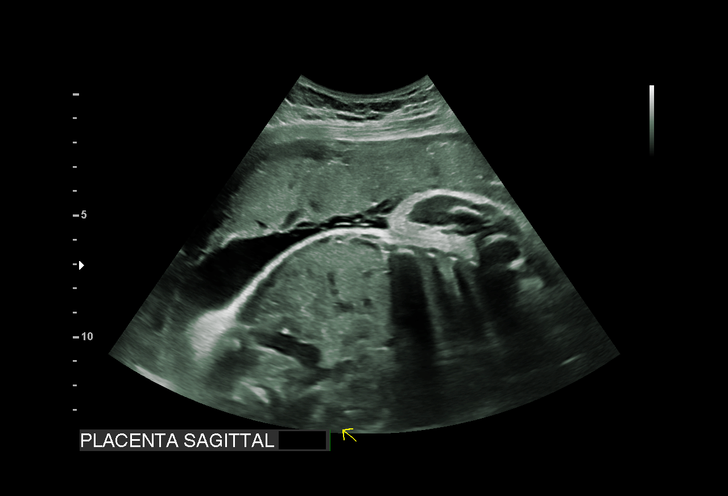
[im 11/99]
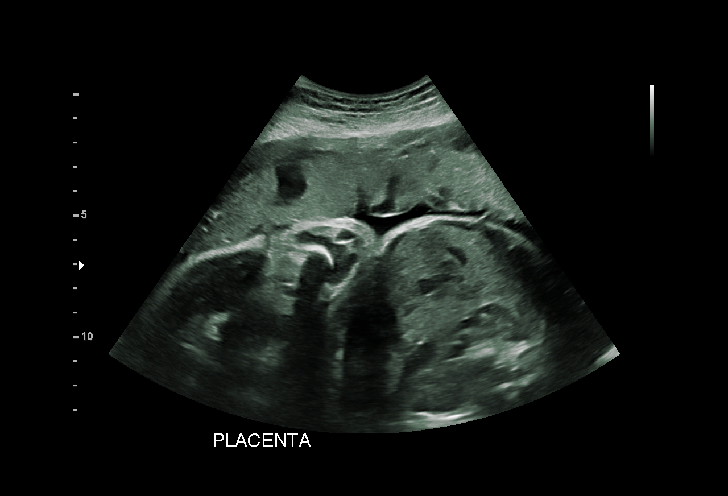
[im 19/99]
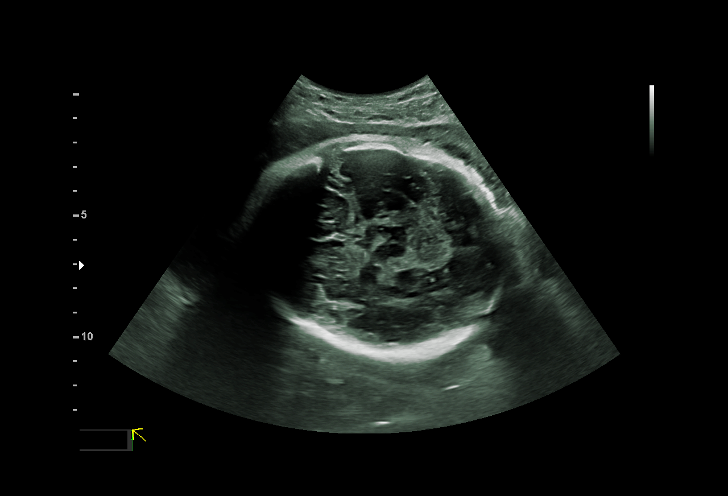
[im 26/99]
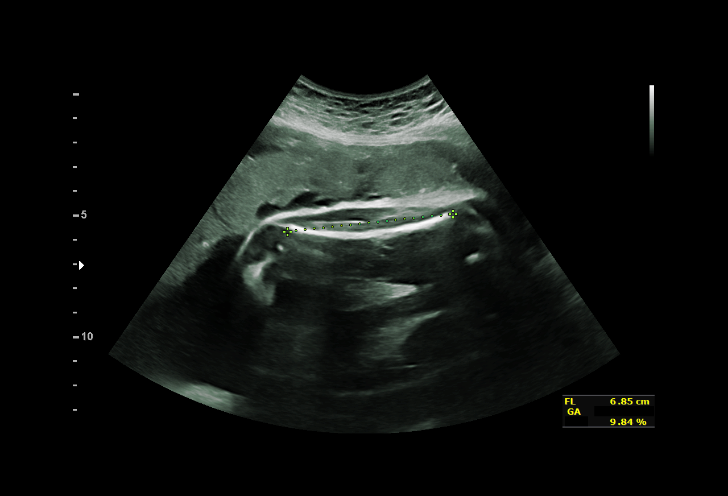
[im 33/99]
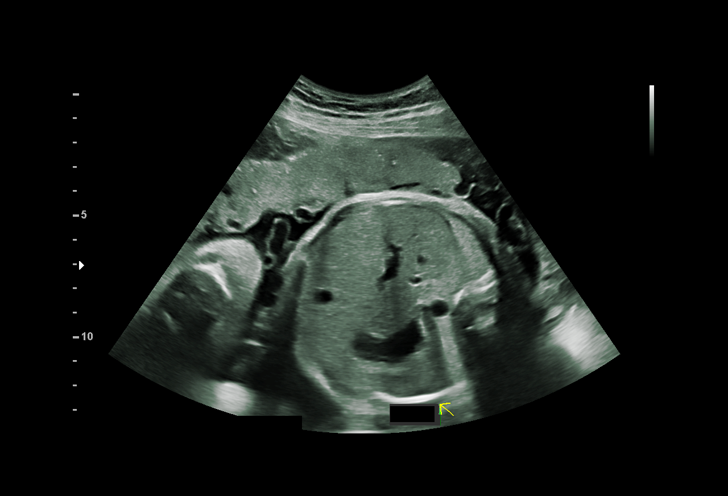
[im 40/99]
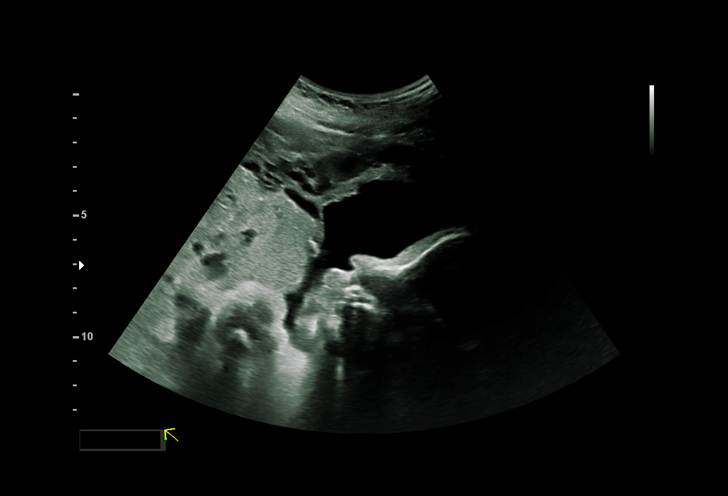
[im 51/99]
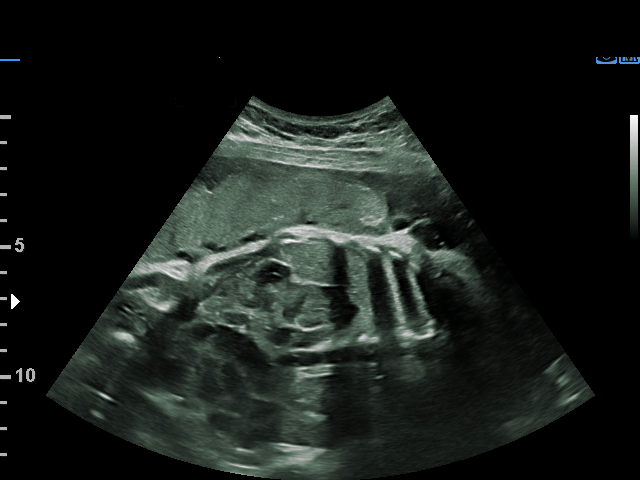
[im 59/99]
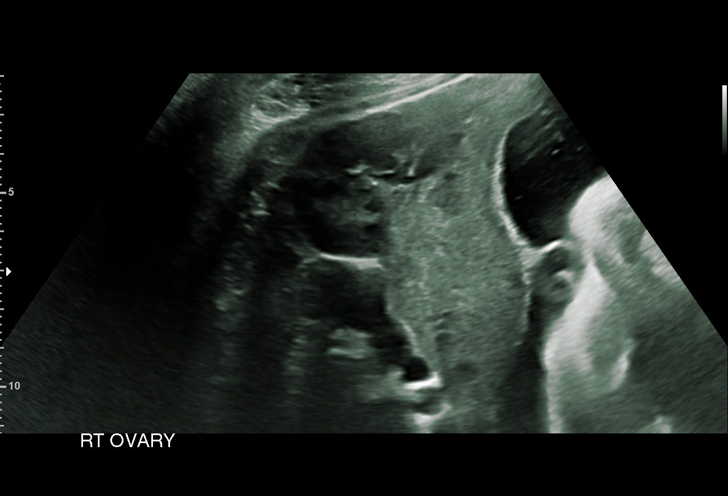
[im 66/99]
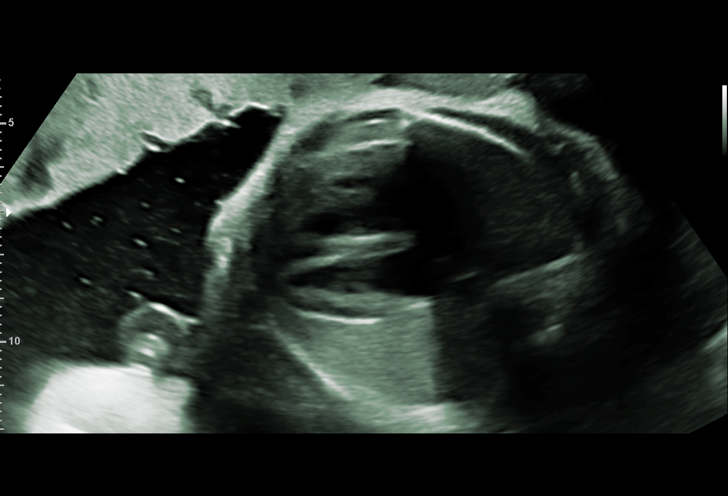
[im 73/99]
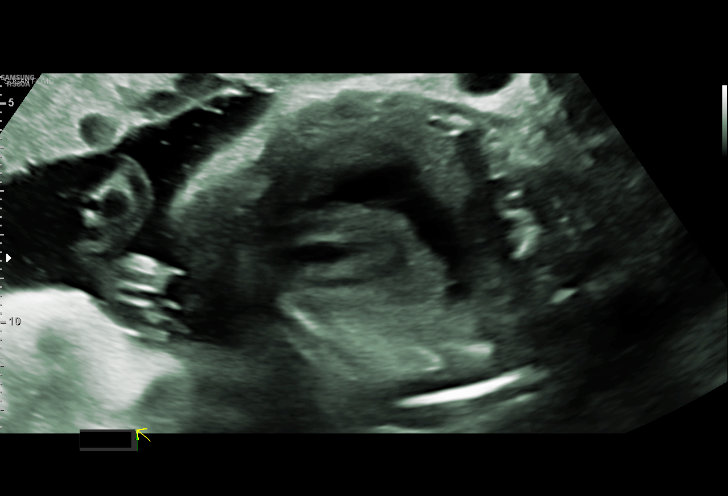
[im 80/99]
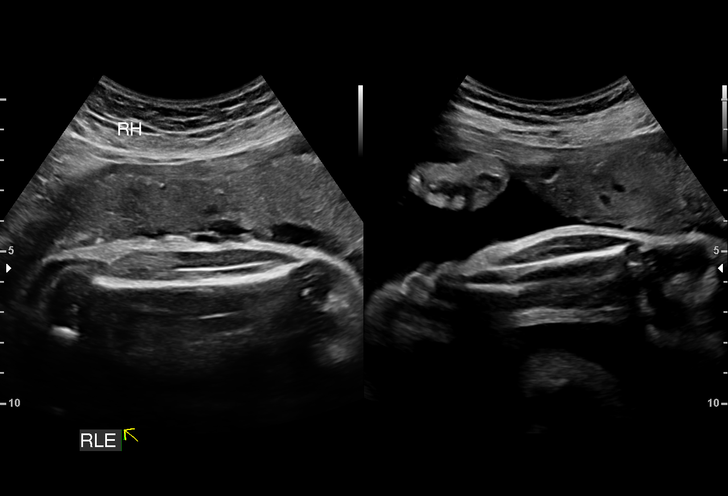
[im 88/99]
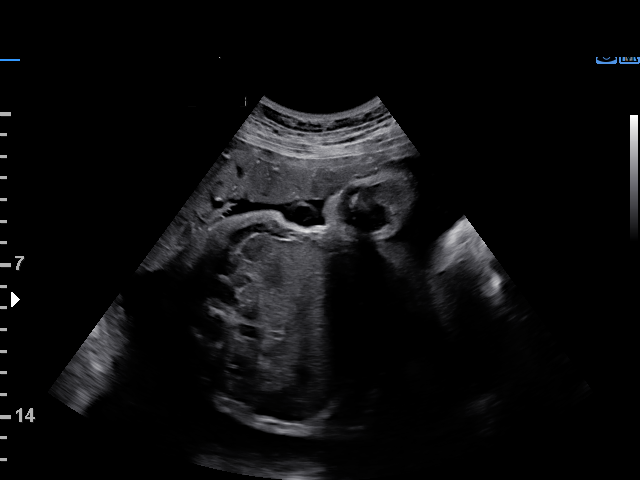
[im 95/99]
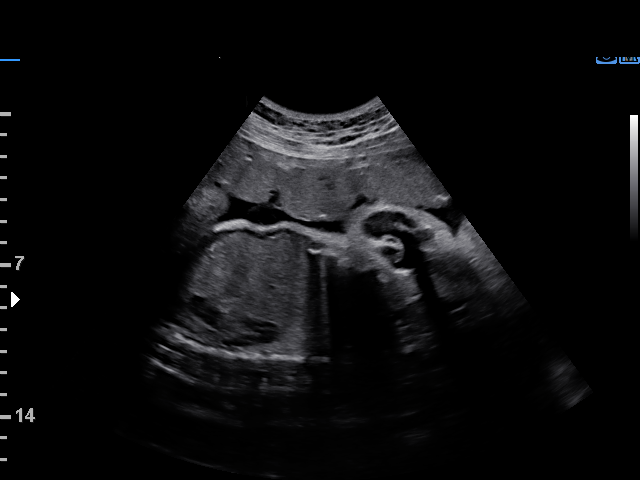

[13 of 28 positions shown; findings below may reference images not displayed]

ZANEB
  2  US MFM UA CORD DOPPLER               76820.02     ITANI
                                                       ZANEB
     STRESS                                            ZANEB
 ----------------------------------------------------------------------

 ----------------------------------------------------------------------
Indications

  Small for gestational age fetus affecting
  management of mother
  Fetal abnormality - other known or
  suspected (EIF, pyelectasis)
  Encounter for other antenatal screening
  follow-up
  37 weeks gestation of pregnancy
 ----------------------------------------------------------------------
Vital Signs

                                                Height:        5'5"
Fetal Evaluation

 Num Of Fetuses:         1
 Fetal Heart Rate(bpm):  137
 Cardiac Activity:       Observed
 Presentation:           Cephalic
 Placenta:               Anterior

 Amniotic Fluid
 AFI FV:      Within normal limits

 AFI Sum(cm)     %Tile       Largest Pocket(cm)
 22.21           86
 RUQ(cm)       RLQ(cm)       LUQ(cm)        LLQ(cm)

Biophysical Evaluation

 Amniotic F.V:   Within normal limits       F. Tone:        Observed
 F. Movement:    Observed                   Score:          [DATE]
 F. Breathing:   Observed
Biometry

 BPD:      88.6  mm     G. Age:  35w 6d         32  %    CI:        74.79   %    70 - 86
                                                         FL/HC:      21.4   %    20.8 -
 HC:      325.1  mm     G. Age:  36w 6d         21  %    HC/AC:      1.04        0.92 -
 AC:       314   mm     G. Age:  35w 2d         18  %    FL/BPD:     78.4   %    71 - 87
 FL:       69.5  mm     G. Age:  35w 5d         17  %    FL/AC:      22.1   %    20 - 24
 HUM:      63.6  mm     G. Age:  36w 6d         68  %
 CER:      45.9  mm     G. Age:  N/A            76  %

 LV:        4.5  mm

 Est. FW:    4343  gm           6 lb     22  %
OB History

 Gravidity:    1
 Living:       0
Gestational Age

 LMP:           37w 0d        Date:  10/21/18                 EDD:   07/28/19
 U/S Today:     36w 0d                                        EDD:   08/04/19
 Best:          37w 0d     Det. By:  LMP  (10/21/18)          EDD:   07/28/19
Anatomy

 Cranium:               Appears normal         LVOT:                   Appears normal
 Cavum:                 Appears normal         Aortic Arch:            Previously seen
 Ventricles:            Appears normal         Ductal Arch:            Previously seen
 Choroid Plexus:        Appears normal         Diaphragm:              Appears normal
 Cerebellum:            Appears normal         Stomach:                Appears normal, left
                                                                       sided
 Posterior Fossa:       Appears normal         Abdomen:                Appears normal
 Nuchal Fold:           Previously seen        Abdominal Wall:         Appears nml (cord
                                                                       insert, abd wall)
 Face:                  Appears normal         Cord Vessels:           Appears normal (3
                        (orbits and profile)                           vessel cord)
 Lips:                  Appears normal         Kidneys:                Appear normal
 Palate:                Appears normal         Bladder:                Appears normal
 Thoracic:              Appears normal         Spine:                  Appears normal
 Heart:                 Echogenic focus        Upper Extremities:      Previously seen
                        in LV, RV not see
 RVOT:                  Previously seen        Lower Extremities:      Previously seen

 Other:  Male gender.
Doppler - Fetal Vessels

 Umbilical Artery
  S/D     %tile     RI              PI                     ADFV    RDFV
 2.08       31   0.52             0.73                        No      No
Cervix Uterus Adnexa

 Cervix
 Not visualized (advanced GA >55wks)

 Left Ovary
 Within normal limits.

 Right Ovary
 Within normal limits.
Comments

 This patient was seen for a follow-up growth scan due to a
 possible IUGR fetus.  She denies any problems since her last
 exam.

 On today's exam, the overall EFW measured at the 22nd
 percentile for her gestational age.  There was an normal
 amniotic fluid noted.

 Doppler studies of the umbilical arteries performed today
 showed a normal S/D ratio of 2.08.  A biophysical profile
 performed today was [DATE].

 The patient will return next week for another biophysical
 profile.

 As the fetal growth is within normal limits today, delivery may
 be considered at around 39 weeks.

## 2020-03-31 IMAGING — US US MFM FETAL BPP W/O NON-STRESS
1 series · 14 of 21 positions shown · non-contrast
Comparison: none

[Series 1: us mfm fetal bpp w/o non-stress · 21 acquisitions, 14 frames shown]
[im 1/21]
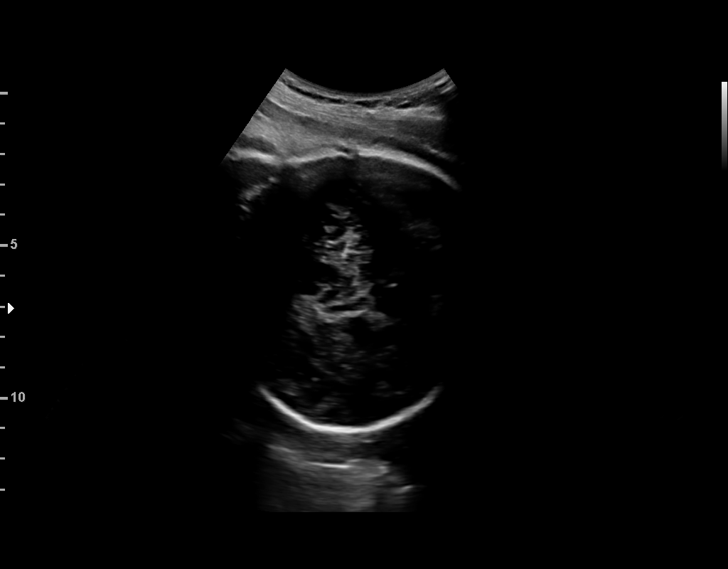
[im 3/21]
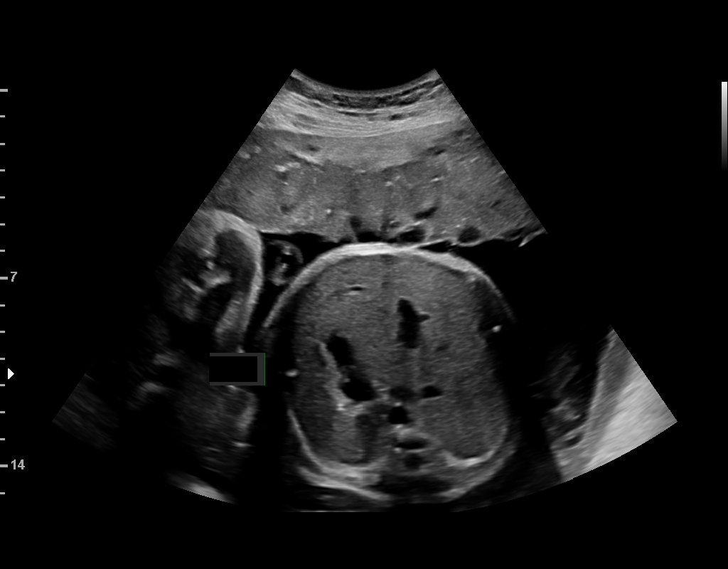
[im 4/21]
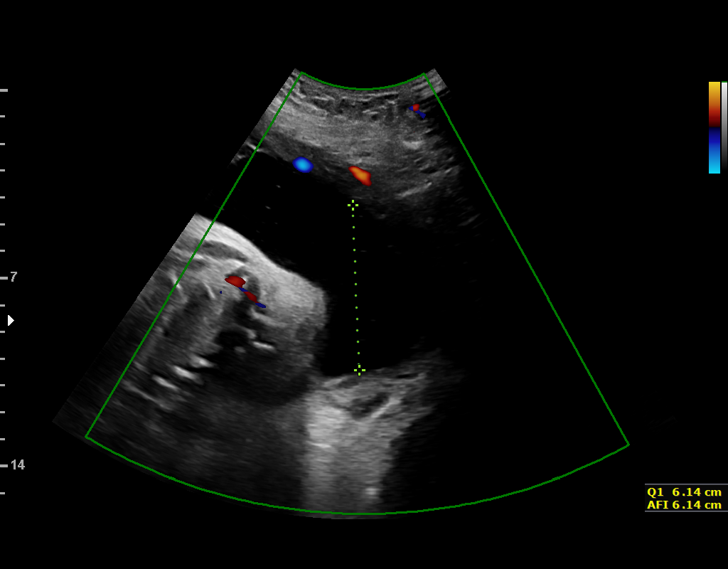
[im 6/21]
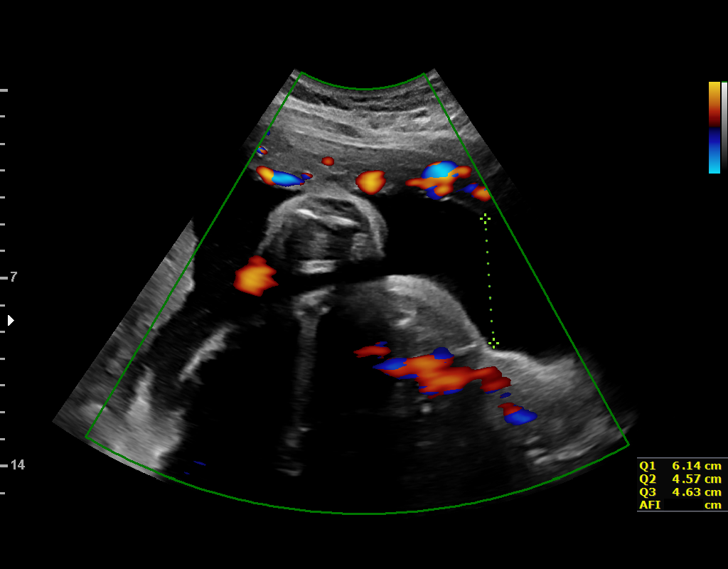
[im 7/21]
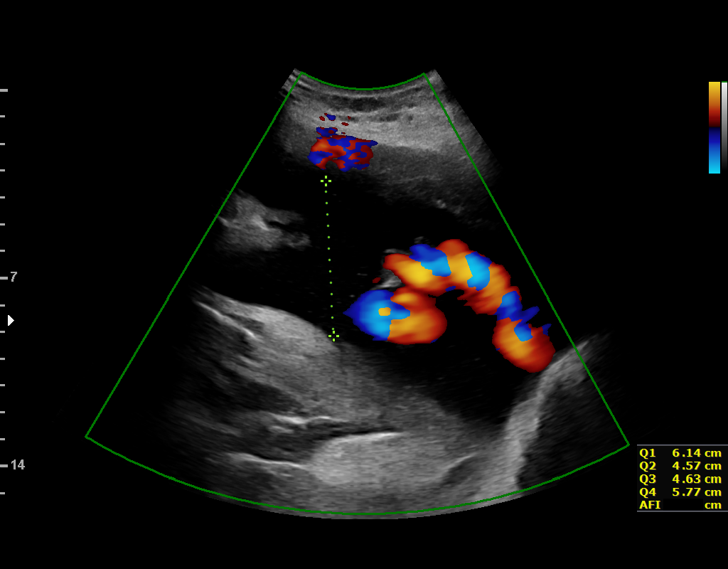
[im 9/21]
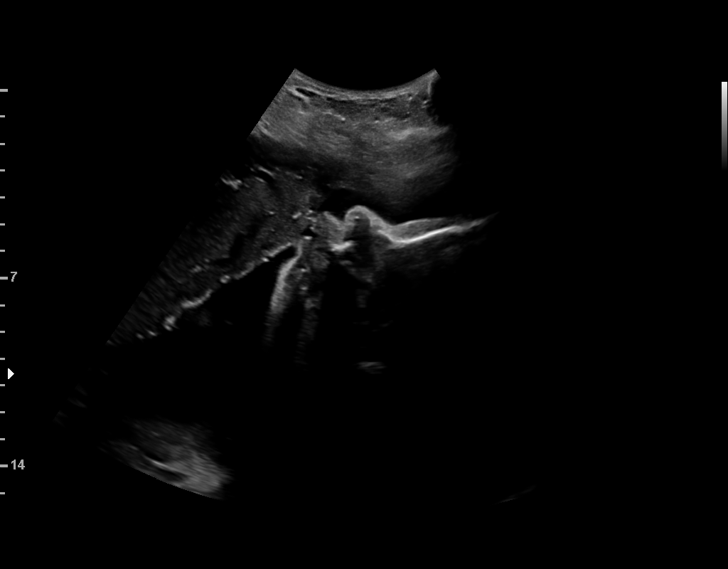
[im 10/21]
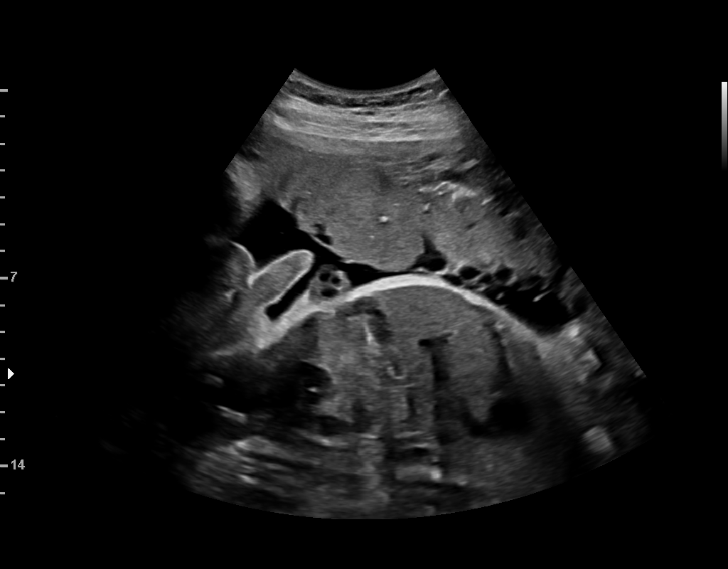
[im 12/21]
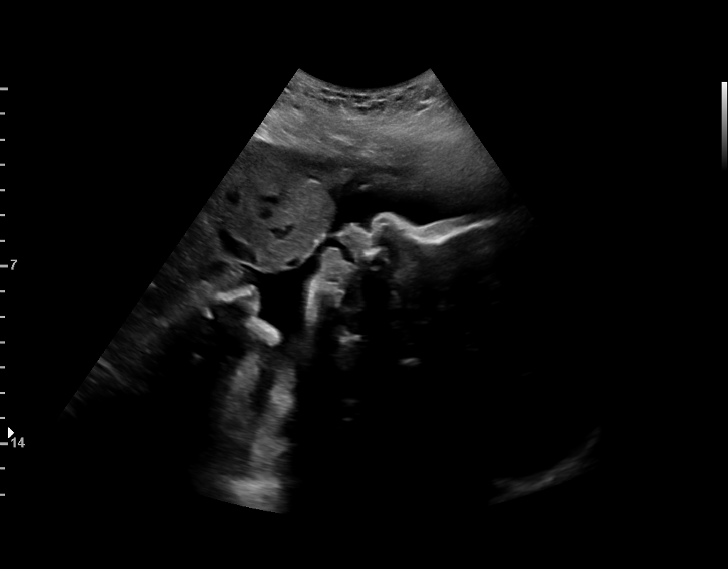
[im 13/21]
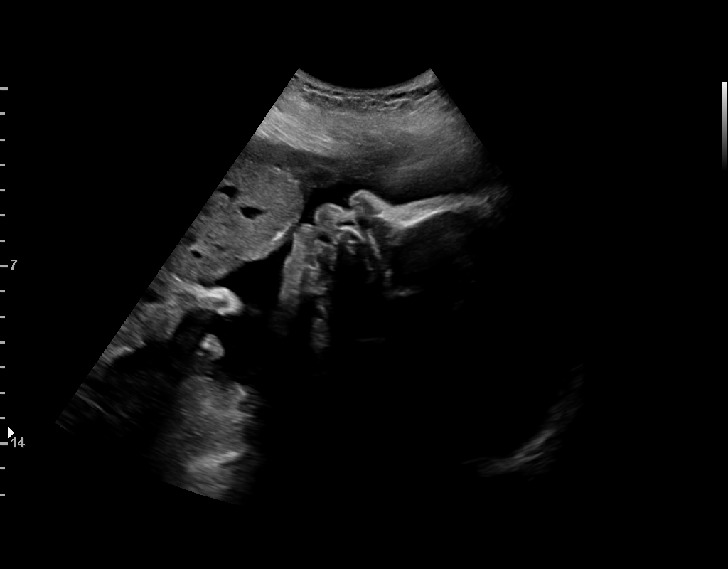
[im 15/21]
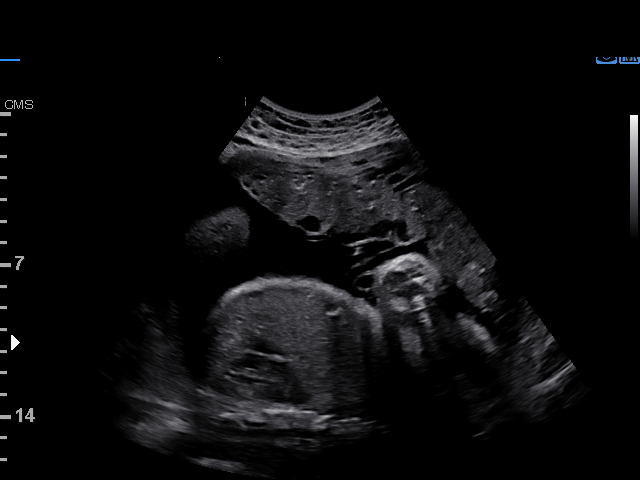
[im 16/21]
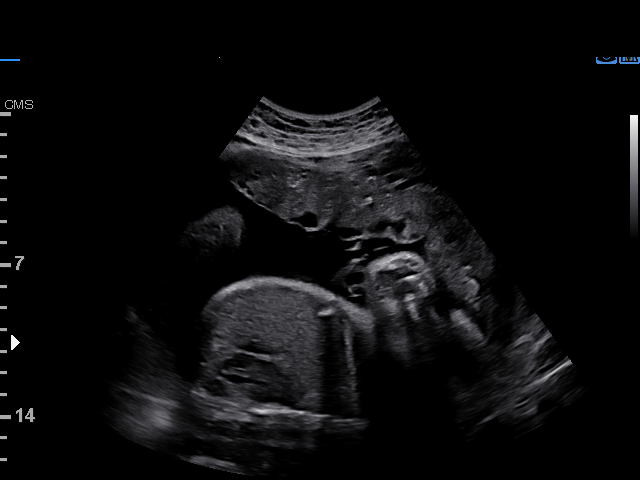
[im 18/21]
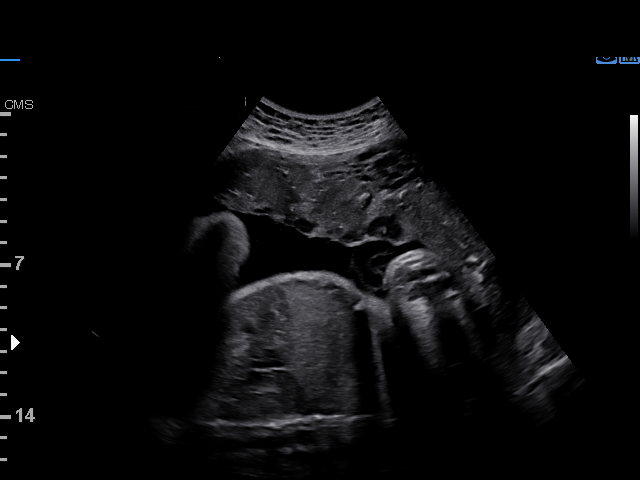
[im 19/21]
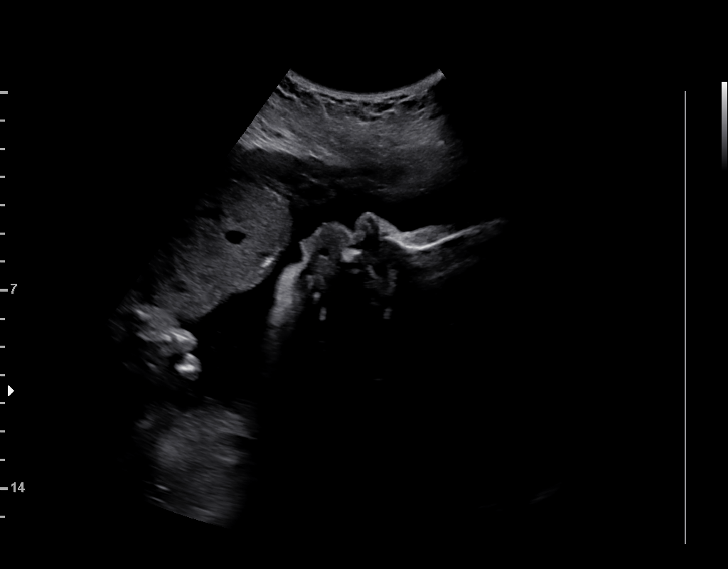
[im 21/21]
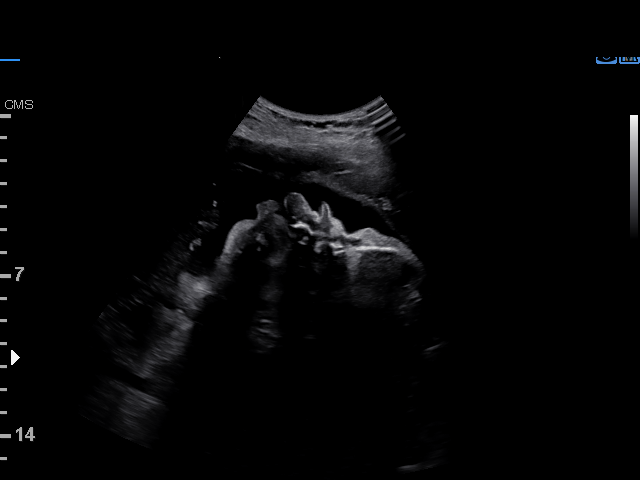

[14 of 21 positions shown; findings below may reference images not displayed]

STRESS                                            LABELLE SALHA
 ----------------------------------------------------------------------

 ----------------------------------------------------------------------
Indications

  Encounter for other antenatal screening
  follow-up
  Small for gestational age fetus affecting
  management of mother
  Fetal abnormality - other known or
  suspected (EIF, pyelectasis) - low risk NIPS
  37 weeks gestation of pregnancy
 ----------------------------------------------------------------------
Vital Signs

                                                Height:        5'5"
Fetal Evaluation

 Num Of Fetuses:         1
 Fetal Heart Rate(bpm):  129
 Cardiac Activity:       Observed
 Presentation:           Cephalic

 Amniotic Fluid
 AFI FV:      Within normal limits

 AFI Sum(cm)     %Tile       Largest Pocket(cm)
 21.11           83

 RUQ(cm)       RLQ(cm)       LUQ(cm)        LLQ(cm)

Biophysical Evaluation

 Amniotic F.V:   Within normal limits       F. Tone:        Observed
 F. Movement:    Observed                   Score:          [DATE]
 F. Breathing:   Observed
OB History

 Gravidity:    1
 Living:       0
Gestational Age

 LMP:           37w 6d        Date:  10/21/18                 EDD:   07/28/19
 Best:          37w 6d     Det. By:  LMP  (10/21/18)          EDD:   07/28/19
Comments

 A biophysical profile performed today was [DATE].
 There is normal amniotic fluid noted on today's ultrasound
 exam.
 As the fetal growth was within normal limits last week,  no
 further exams were scheduled in our office.

## 2020-12-10 ENCOUNTER — Ambulatory Visit (HOSPITAL_COMMUNITY)
Admission: EM | Admit: 2020-12-10 | Discharge: 2020-12-10 | Disposition: A | Discharge status: Home / Self Care | Payer: Medicaid Other | Attending: Internal Medicine | Admitting: Internal Medicine

## 2020-12-10 ENCOUNTER — Other Ambulatory Visit: Payer: Self-pay

## 2020-12-10 ENCOUNTER — Encounter (HOSPITAL_COMMUNITY): Payer: Self-pay | Admitting: *Deleted

## 2020-12-10 DIAGNOSIS — N898 Other specified noninflammatory disorders of vagina: Secondary | ICD-10-CM

## 2020-12-10 DIAGNOSIS — Z3202 Encounter for pregnancy test, result negative: Secondary | ICD-10-CM

## 2020-12-10 DIAGNOSIS — N926 Irregular menstruation, unspecified: Secondary | ICD-10-CM | POA: Insufficient documentation

## 2020-12-10 LAB — POC URINE PREG, ED: Preg Test, Ur: NEGATIVE

## 2020-12-10 MED ORDER — FLUCONAZOLE 150 MG PO TABS: 150.0000 mg | ORAL_TABLET | Freq: Every day | ORAL | 0 refills | Status: AC

## 2020-12-10 NOTE — ED Provider Notes (Signed)
[facility]    CSN: [id number] Arrival date & time: 12/10/20  1515      History   Chief Complaint Chief Complaint  Patient presents with  . Vaginal Discharge  . Possible Pregnancy    HPI Tonya Martin is a 25 y.o. female.   HPI  Vaginal Discharge: Pt states that she has had vaginal discharge for the past 2 days. She described vaginal itching and a thicker white discharge. No current abdominal pain, fevers, dysuria, urinary frequency. Used a small amount of old Yeast infection cream without improvement. She also wishes to have a pregnancy test today as her [facility] was before she had her IUD inserted over a month ago. She reports that she was informed to not have sexual intercourse for 1 week after IUD placement but that she did not follow these precautions and wants to ensure that she is not pregnant.    Past Medical History:  Diagnosis Date  . Allergy   . Anxiety   . Depression   . Medical history non-contributory   . PTSD (post-traumatic stress disorder)   . Vision abnormalities     Patient Active Problem List   Diagnosis Date Noted  . Normal labor 08/02/2019  . Post term pregnancy over 40 weeks 08/02/2019  . IUD (intrauterine device) in place 08/02/2019  . Supervision of normal pregnancy, antepartum 01/19/2019  . Other seasonal allergic rhinitis 12/27/2015  . Adjustment disorder with mixed anxiety and depressed mood 04/13/2013  . Food allergy 04/13/2013    Past Surgical History:  Procedure Laterality Date  . NO PAST SURGERIES      OB History    Gravida  1   Para  1   Term  1   Preterm      AB      Living  1     SAB      IAB      Ectopic      Multiple  0   Live Births  1            Home Medications    Prior to Admission medications   Medication Sig Start Date End Date Taking? Authorizing Provider  ibuprofen (ADVIL) 600 MG tablet Take 1 tablet (600 mg total) by mouth every 6 (six) hours as needed. 02/18/20   Elpidio Anis, PA-C   Prenatal Vit-Fe Fumarate-FA (PRENATAL VITAMIN) 27-0.8 MG TABS Take 1 tablet by mouth daily. 12/15/18   Verneda Skill, FNP    Family History Family History  Problem Relation Age of Onset  . Cancer Mother   . Diabetes Mother   . Hyperlipidemia Mother   . Hypertension Mother     Social History Social History   Tobacco Use  . Smoking status: Never Smoker  . Smokeless tobacco: Never Used  . Tobacco comment: marijuana   Vaping Use  . Vaping Use: Never used  Substance Use Topics  . Alcohol use: Never    Alcohol/week: 0.0 standard drinks  . Drug use: Not Currently    Types: Marijuana    Comment: "about 3 weeks ago" as of 10/21     Allergies   Peanut-containing drug products and Flonase [fluticasone propionate]   Review of Systems Review of Systems  As discussed above in HPI Physical Exam Triage Vital Signs ED Triage Vitals  Enc Vitals Group     BP 12/10/20 1532 129/69     Pulse Rate 12/10/20 1532 89     Resp 12/10/20 1532 16  Temp --      Temp Source 12/10/20 1532 Oral     SpO2 12/10/20 1532 100 %     Weight --      Height --      Head Circumference --      Peak Flow --      Pain Score 12/10/20 1530 0     Pain Loc --      Pain Edu? --      Excl. in GC? --    No data found.  Updated Vital Signs BP 129/69 (BP Location: Right Arm)   Pulse 89   Resp 16   SpO2 100%   Physical Exam Vitals and nursing note reviewed.  Constitutional:      Appearance: Normal appearance.  Cardiovascular:     Rate and Rhythm: Normal rate and regular rhythm.     Heart sounds: Normal heart sounds.  Pulmonary:     Effort: Pulmonary effort is normal.     Breath sounds: Normal breath sounds.  Abdominal:     General: Abdomen is flat. Bowel sounds are normal. There is no distension.     Palpations: Abdomen is soft. There is no mass.     Tenderness: There is no abdominal tenderness. There is no right CVA tenderness, left CVA tenderness, guarding or rebound.     Hernia: No  hernia is present.  Genitourinary:    Comments: Pt performs self swab collection prior to visit Lymphadenopathy:     Cervical: No cervical adenopathy.  Neurological:     Mental Status: She is alert.      UC Treatments / Results  Labs (all labs ordered are listed, but only abnormal results are displayed) Labs Reviewed - No data to display  EKG   Radiology No results found.  Procedures Procedures (including critical care time)  Medications Ordered in UC Medications - No data to display  Initial Impression / Assessment and Plan / UC Course  I have reviewed the triage vital signs and the nursing notes.  Pertinent labs & imaging results that were available during my care of the patient were reviewed by me and considered in my medical decision making (see chart for details).     New. HCG negative. Treating with diflucan while we await her vaginal swab testing. Discussed red flag signs and symptoms.  Final Clinical Impressions(s) / UC Diagnoses   Final diagnoses:  None   Discharge Instructions   None    ED Prescriptions    None     PDMP not reviewed this encounter.   Rushie Chestnut, [place] 12/10/20 1553

## 2020-12-10 NOTE — ED Triage Notes (Signed)
Pt reports a vaginal DC  And also request a Preg. Test.

## 2020-12-11 ENCOUNTER — Telehealth (HOSPITAL_COMMUNITY): Payer: Self-pay | Admitting: Emergency Medicine

## 2020-12-11 LAB — CERVICOVAGINAL ANCILLARY ONLY
Bacterial Vaginitis (gardnerella): POSITIVE — AB
Candida Glabrata: NEGATIVE
Candida Vaginitis: POSITIVE — AB
Chlamydia: NEGATIVE
Comment: NEGATIVE
Comment: NEGATIVE
Comment: NEGATIVE
Comment: NEGATIVE
Comment: NEGATIVE
Comment: NORMAL
Neisseria Gonorrhea: NEGATIVE
Trichomonas: NEGATIVE

## 2020-12-11 MED ORDER — METRONIDAZOLE 0.75 % VA GEL: 1.0000 | Freq: Every day | VAGINAL | 0 refills | Status: AC

## 2021-05-10 ENCOUNTER — Encounter (HOSPITAL_COMMUNITY): Payer: Self-pay | Admitting: Emergency Medicine

## 2021-05-10 ENCOUNTER — Emergency Department (HOSPITAL_COMMUNITY)
Admission: EM | Admit: 2021-05-10 | Discharge: 2021-05-10 | Discharge status: Against Medical Advice | Payer: Medicaid Other | Attending: Emergency Medicine | Admitting: Emergency Medicine

## 2021-05-10 DIAGNOSIS — N939 Abnormal uterine and vaginal bleeding, unspecified: Secondary | ICD-10-CM | POA: Diagnosis not present

## 2021-05-10 DIAGNOSIS — Z30432 Encounter for removal of intrauterine contraceptive device: Secondary | ICD-10-CM | POA: Insufficient documentation

## 2021-05-10 DIAGNOSIS — Z5321 Procedure and treatment not carried out due to patient leaving prior to being seen by health care provider: Secondary | ICD-10-CM | POA: Insufficient documentation

## 2021-05-10 NOTE — ED Notes (Signed)
Patient told this tech she was leaving, this tech encouraged patient to stay to be seen, patient left.

## 2021-05-10 NOTE — ED Provider Notes (Signed)
Emergency Medicine Provider Triage Evaluation Note  Tonya Martin , a 25 y.o. female  was evaluated in triage.  Pt complains of presents with IUD that needs to be removed, states she was engaging in sexual activities on Tuesday and felt that it moved, she is now having intermittent pains in her pelvic region, has had vaginal bleeding, denies vaginal discharge, no urinary symptoms.  States that her OB/GYN in [place] placed the IUD originally but cannot go there to have removed. Review of Systems  Positive: Pelvic pain, vaginal bleeding Negative: Urinary symptoms, abdominal pain  Physical Exam  BP 129/71 (BP Location: Left Arm)   Pulse 66   Temp 98.7 F (37.1 C) (Oral)   Resp 18   SpO2 99%  Gen:   Awake, no distress   Resp:  Normal effort  MSK:   Moves extremities without difficulty  Other:    Medical Decision Making  Medically screening exam initiated at 2:47 PM.  Appropriate orders placed.  Corie Allis was informed that the remainder of the evaluation will be completed by another provider, this initial triage assessment does not replace that evaluation, and the importance of remaining in the ED until their evaluation is complete.  Presents with IUD removal patient will need further evaluation.   Carroll Sage, PA-C 05/10/21 1449    Rozelle Logan, DO 05/11/21 [phone]

## 2021-05-10 NOTE — ED Triage Notes (Signed)
Patient here requesting removal of IUD. States during  sexual intercourse two days ago patient felt a sudden intense pinch accompanied by vaginal bleeding that has increased over the last two days.

## 2022-03-07 LAB — HM PAP SMEAR

## 2023-05-26 ENCOUNTER — Ambulatory Visit: Payer: Medicaid Other | Admitting: Obstetrics and Gynecology

## 2023-07-08 ENCOUNTER — Ambulatory Visit: Payer: Medicaid Other | Admitting: Obstetrics and Gynecology

## 2024-05-31 ENCOUNTER — Other Ambulatory Visit: Payer: Self-pay

## 2024-06-01 ENCOUNTER — Encounter: Payer: MEDICAID | Admitting: Obstetrics and Gynecology

## 2024-06-18 ENCOUNTER — Ambulatory Visit: Payer: MEDICAID | Admitting: Family Medicine

## 2024-06-18 ENCOUNTER — Encounter: Payer: Self-pay | Admitting: Family Medicine

## 2024-06-18 VITALS — BP 123/79 | HR 74 | Ht 65.0 in | Wt 213.0 lb

## 2024-06-18 DIAGNOSIS — N750 Cyst of Bartholin's gland: Secondary | ICD-10-CM

## 2024-06-18 DIAGNOSIS — N898 Other specified noninflammatory disorders of vagina: Secondary | ICD-10-CM | POA: Diagnosis not present

## 2024-06-18 DIAGNOSIS — N9089 Other specified noninflammatory disorders of vulva and perineum: Secondary | ICD-10-CM | POA: Diagnosis not present

## 2024-06-18 DIAGNOSIS — Z975 Presence of (intrauterine) contraceptive device: Secondary | ICD-10-CM

## 2024-06-18 DIAGNOSIS — Z1331 Encounter for screening for depression: Secondary | ICD-10-CM

## 2024-06-18 NOTE — Progress Notes (Signed)
 History:  [name] is a 28 y.o. G1P1001 who presents to clinic today for IUD removal. She reports that she has had two unsuccessful removal attempts at [facility] and was referred here. She has been experiencing sharp pains in her groin during bowel movements, orgasm, and randomly for about 2 years. This is her third IUD, she does have a history of IUD migration. Her PCP and [facility] tried during two different appointments to remove the IUD. During each appointment, they made 4-6 attempts to remove the IUD without success. Tonya Martin states the procedure was crampy and she felt tugging but was tolerable. As far as she knows, the strings are intact.  She also has a history of abnormal pap smear in 2023 of LSIL. She states that her PCP with [facility] has been following up with pap smears since then. Her other concerns today are a painful and swollen mass on her right labia/inguinal region that she worries is swollen lymph nodes as well as a burning lesion on her left vulva/perineum.  The following portions of the patient's history were reviewed and updated as appropriate: allergies, current medications, family history, past medical history, social history, past surgical history and problem list.  Review of Systems:  ROS See HPI   Objective:  Physical Exam BP 123/79   Pulse 74   Ht 5' 5 (1.651 m)   Wt 213 lb (96.6 kg)   LMP 06/12/2024 (Exact Date)   Breastfeeding No   BMI 35.45 kg/m  Physical Exam Exam conducted with a chaperone present.  Constitutional:      General: She is not in acute distress.    Appearance: Normal appearance.  HENT:     Head: Normocephalic and atraumatic.  Pulmonary:     Effort: Pulmonary effort is normal. No respiratory distress.  Abdominal:     Hernia: There is no hernia in the left inguinal area or right inguinal area.  Genitourinary:    Exam position: Lithotomy position.     Pubic Area: No rash or pubic lice.      Tanner stage (genital):  5.     Labia:        Right: No rash, tenderness, lesion or injury.        Left: Lesion (erythematous lesion, open wound) present. No rash, tenderness or injury.      Urethra: No prolapse or urethral pain.     Vagina: No vaginal discharge, erythema or bleeding.     Cervix: No friability, lesion, erythema or cervical bleeding.     Uterus: Normal. Not tender.      Adnexa:        Right: No mass, tenderness or fullness.         Left: No mass, tenderness or fullness.       Rectum: No anal fissure or external hemorrhoid.      Comments: Palpable swelling of right lower labia in area of Bartholin's gland Two brown IUD strings in place protruding ~2cm from cervical os Musculoskeletal:     Cervical back: Normal range of motion.  Lymphadenopathy:     Lower Body: No right inguinal adenopathy. No left inguinal adenopathy.  Neurological:     General: No focal deficit present.     Mental Status: She is alert and oriented to person, place, and time. Mental status is at baseline.  Psychiatric:        Mood and Affect: Mood normal.        Thought Content: Thought content normal.  Judgment: Judgment normal.    Bedside US :  IUD in uterus  Assessment & Plan:  1. Vaginal lesion (Primary) Testing lesion on left labia for HSV. - Herpes simplex virus culture  2. Swelling of vulva Suspicious for Bartholin gland cyst. Patient states she has had Bartholin gland cyst or abscess previously.   3. Bartholin gland cyst No erythema or warmth, not currently inflamed. Recommend office visit if symptomatic again for possible drainage.  4. IUD (intrauterine device) in place IUD strings in place on exam. Bedside US  shows IUD in place. Will get formal US  to confirm no malpositioning and no embedding in endometrial wall. If US  normal, consult with GYN and plan to remove in office. Schedule follow up appointment after US  results.  I spent 45 minutes on the day of the encounter to include pre-visit record  review, face-to-face time with the patient, reviewing bedside US  images, and post visit ordering of US  test.   Joesph DELENA Sear, PA

## 2024-06-19 ENCOUNTER — Ambulatory Visit (HOSPITAL_BASED_OUTPATIENT_CLINIC_OR_DEPARTMENT_OTHER): Admission: RE | Admit: 2024-06-19 | Payer: MEDICAID | Source: Ambulatory Visit

## 2024-06-22 ENCOUNTER — Ambulatory Visit: Payer: Self-pay | Admitting: Family Medicine

## 2024-06-22 DIAGNOSIS — L989 Disorder of the skin and subcutaneous tissue, unspecified: Secondary | ICD-10-CM

## 2024-06-22 LAB — HERPES SIMPLEX VIRUS CULTURE

## 2024-06-22 MED ORDER — MUPIROCIN 2 % EX OINT
1.0000 | TOPICAL_OINTMENT | Freq: Two times a day (BID) | CUTANEOUS | 0 refills | Status: AC
Start: 2024-06-22 — End: ?

## 2024-06-26 ENCOUNTER — Ambulatory Visit (HOSPITAL_BASED_OUTPATIENT_CLINIC_OR_DEPARTMENT_OTHER)
Admission: RE | Admit: 2024-06-26 | Discharge: 2024-06-26 | Disposition: A | Discharge status: Home / Self Care | Payer: MEDICAID | Source: Ambulatory Visit | Attending: Family Medicine | Admitting: Family Medicine

## 2024-06-26 DIAGNOSIS — Z975 Presence of (intrauterine) contraceptive device: Secondary | ICD-10-CM | POA: Diagnosis present

## 2024-07-19 ENCOUNTER — Encounter: Payer: Self-pay | Admitting: Family Medicine

## 2024-07-19 ENCOUNTER — Other Ambulatory Visit (HOSPITAL_COMMUNITY)
Admission: RE | Admit: 2024-07-19 | Discharge: 2024-07-19 | Disposition: A | Discharge status: Home / Self Care | Payer: MEDICAID | Source: Ambulatory Visit | Attending: Family Medicine | Admitting: Family Medicine

## 2024-07-19 ENCOUNTER — Ambulatory Visit (INDEPENDENT_AMBULATORY_CARE_PROVIDER_SITE_OTHER): Payer: MEDICAID | Admitting: Family Medicine

## 2024-07-19 VITALS — BP 112/70 | HR 81 | Wt 224.0 lb

## 2024-07-19 DIAGNOSIS — Z113 Encounter for screening for infections with a predominantly sexual mode of transmission: Secondary | ICD-10-CM | POA: Diagnosis not present

## 2024-07-19 DIAGNOSIS — Z124 Encounter for screening for malignant neoplasm of cervix: Secondary | ICD-10-CM | POA: Diagnosis present

## 2024-07-19 DIAGNOSIS — Z30432 Encounter for removal of intrauterine contraceptive device: Secondary | ICD-10-CM

## 2024-07-19 DIAGNOSIS — T8332XD Displacement of intrauterine contraceptive device, subsequent encounter: Secondary | ICD-10-CM | POA: Diagnosis not present

## 2024-07-19 DIAGNOSIS — T8332XA Displacement of intrauterine contraceptive device, initial encounter: Secondary | ICD-10-CM | POA: Insufficient documentation

## 2024-07-19 NOTE — Progress Notes (Signed)
    Subjective:  Tonya Martin is a 28 y.o. female who presents to the clinic today for IUD removal and Pap smear  HPI:  Patient is a 28 year old G1, P1 who presents for IUD removal.  She was previously seen in our clinic and evaluated and no strings were noted at the cervical os.  She had multiple attempts to remove the IUD by her primary care doctor.  Ultrasound was performed showing density within the cervical canal consistent with an IUD that is malpositioned.  Patient also presenting for Pap smear and STD screening.  Reports abnormal vaginal discharge and she would like to be tested for STDs including blood test.  Regarding her Pap smear she reports an abnormal Pap smear L SIL back in 2023 and repeat Pap smear was never done.   Objective:  Physical Exam: BP 112/70   Pulse 81   Wt 224 lb (101.6 kg)   LMP 07/09/2024   BMI 37.28 kg/m   General: Alert, well-appearing, no acute distress Cardiac: Regular rate and rhythm Respiratory: Normal work of breathing, speaking in full sentences Abdomen: Soft, nontender GU: Normal external female genitalia, normal-appearing cervix with IUD strings appreciated at cervical os.  Mild amount of discharge, mild cervical friability.  See removal note below.   No results found for this or any previous visit (from the past 72 hours).  PROCEDURE NOTE: IUD removal Patient given informed consent for IUD removal. She is aware this will stop the birth control method provided by the IUD immediately. Informed consent given and signed copy in the chart.Appropriate time out taken. Patient placed in the lithotomy position and the cervix brought into view using speculum. The IUD strings were identified coming from the cervical os. These strings were grasped with ring forceps, and with gentle pressure the tip of the IUD was noted at the cervical os but it would not come.  IUD grasped with an additional pair of forceps and removed from the cervical os.  Small amount of  bleeding appreciated.  Patient tolerated the procedure well.   Assessment/Plan:  Malpositioned intrauterine device IUD noted in cervical canal.  Removed with moderate difficulty.  Patient tolerated the procedure well.  Discussed replacement but patient would like to wait a month or 2 prior to having IUD replaced.  Declines contraception at this time.  Cervical cancer screening Pap smear collected today.  Follow-up as needed.  Screening examination for STI Patient reports abnormal vaginal discharge.  Believes it is BV but would like to be screened for STDs.  Would like blood test as well.  Wet prep collected along with STD swabs.  HIV/RPR/hepatitis also ordered.   Lab Orders         HIV antibody (with reflex)         Hepatitis C Antibody         RPR      No orders of the defined types were placed in this encounter.     Steffan Rover, MD Attending Family Medicine Physician, [facility] for [facility], [facility]   07/19/24 3:25 PM

## 2024-07-19 NOTE — Assessment & Plan Note (Signed)
 IUD noted in cervical canal.  Removed with moderate difficulty.  Patient tolerated the procedure well.  Discussed replacement but patient would like to wait a month or 2 prior to having IUD replaced.  Declines contraception at this time.

## 2024-07-19 NOTE — Progress Notes (Signed)
 Pt is in office for IUD removal  - has had in 3-4 years.  Pt is unsure what other method she may want.   Pt would like pap and testing today.  Last pap 203 -  LSIL

## 2024-07-19 NOTE — Assessment & Plan Note (Addendum)
 Pap smear collected today.  Follow-up as needed.

## 2024-07-19 NOTE — Assessment & Plan Note (Signed)
 Patient reports abnormal vaginal discharge.  Believes it is BV but would like to be screened for STDs.  Would like blood test as well.  Wet prep collected along with STD swabs.  HIV/RPR/hepatitis also ordered.

## 2024-07-20 LAB — CERVICOVAGINAL ANCILLARY ONLY
Chlamydia: NEGATIVE
Comment: NEGATIVE
Comment: NEGATIVE
Comment: NORMAL
Neisseria Gonorrhea: NEGATIVE
Trichomonas: NEGATIVE

## 2024-07-20 LAB — RPR: RPR Ser Ql: NONREACTIVE

## 2024-07-20 LAB — HEPATITIS C ANTIBODY: Hep C Virus Ab: NONREACTIVE

## 2024-07-20 LAB — HIV ANTIBODY (ROUTINE TESTING W REFLEX): HIV Screen 4th Generation wRfx: NONREACTIVE

## 2024-07-23 ENCOUNTER — Ambulatory Visit: Payer: Self-pay

## 2024-07-26 LAB — CYTOLOGY - PAP
Comment: NEGATIVE
High risk HPV: POSITIVE — AB

## 2024-08-11 ENCOUNTER — Ambulatory Visit: Payer: MEDICAID | Admitting: Obstetrics and Gynecology

## 2024-08-11 NOTE — Progress Notes (Deleted)
 28 y.o.GYN presents for COLPO.  UPT   HIGH RISK HPV ([place]): Positive Abnormal  ADEQUACY: Satisfactory for evaluation; transformation zone component PRESENT. 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

## 2024-08-11 NOTE — Progress Notes (Unsigned)
 Patient with heavy menses here for colposcopy. Patient reports onset of menses on Monday, still heavy and typically starts to taper off by day 5 or 6. Patient will be rescheduled for procedure

## 2024-08-30 ENCOUNTER — Ambulatory Visit: Payer: MEDICAID | Admitting: Obstetrics and Gynecology

## 2024-08-30 ENCOUNTER — Other Ambulatory Visit (HOSPITAL_COMMUNITY)
Admission: RE | Admit: 2024-08-30 | Discharge: 2024-08-30 | Disposition: A | Discharge status: Home / Self Care | Payer: MEDICAID | Source: Ambulatory Visit | Attending: Obstetrics and Gynecology | Admitting: Obstetrics and Gynecology

## 2024-08-30 ENCOUNTER — Encounter: Payer: Self-pay | Admitting: Obstetrics and Gynecology

## 2024-08-30 VITALS — BP 119/77 | HR 84 | Wt 223.0 lb

## 2024-08-30 DIAGNOSIS — R87612 Low grade squamous intraepithelial lesion on cytologic smear of cervix (LGSIL): Secondary | ICD-10-CM

## 2024-08-30 NOTE — Progress Notes (Signed)
 28 yo P1 here for scheduled colposcopy. Patient with recent LGSIL + HPV on 07/2024 pap smear.   Past Medical History:  Diagnosis Date   Allergy    Anxiety    Depression    Medical history non-contributory    PTSD (post-traumatic stress disorder)    Vision abnormalities    Past Surgical History:  Procedure Laterality Date   NO PAST SURGERIES     Family History  Problem Relation Age of Onset   Cancer Mother    Diabetes Mother    Hyperlipidemia Mother    Hypertension Mother    Social History   Tobacco Use   Smoking status: Never   Smokeless tobacco: Never   Tobacco comments:    marijuana   Vaping Use   Vaping status: Every Day  Substance Use Topics   Alcohol use: Yes    Comment: Social   Drug use: Yes    Types: Marijuana   ROS See pertinent in HPI. All other systems reviewed and non contributory Blood pressure 119/77, pulse 84, weight 223 lb (101.2 kg), last menstrual period 08/09/2024.  GENERAL: Well-developed, well-nourished female in no acute distress.  ABDOMEN: Soft, nontender, nondistended. No organomegaly. PELVIC: Normal external female genitalia. Vagina is pink and rugated.  Normal discharge. Normal appearing cervix. Chaperone present during the pelvic exam NEURO: alert and oriented x 3  A/P 28 yo with abnormal pap smear here for colposcopy - Discussed benefits of Gardasil vaccine which patient received - Patient given informed consent, signed copy in the chart, time out was performed.  Placed in lithotomy position. Cervix viewed with speculum and colposcope after application of acetic acid.   Colposcopy adequate?  yes Acetowhite lesions?no Punctation?no Mosaicism?  no Abnormal vasculature?  no Biopsies?no ECC?yes  COMMENTS: Patient was given post procedure instructions.  She will return in 2 weeks for results.  Winton Felt, MD

## 2024-09-01 LAB — SURGICAL PATHOLOGY

## 2024-09-04 ENCOUNTER — Ambulatory Visit: Payer: Self-pay | Admitting: Obstetrics and Gynecology

## 2024-09-20 ENCOUNTER — Other Ambulatory Visit: Payer: Self-pay

## 2024-09-20 ENCOUNTER — Encounter (HOSPITAL_COMMUNITY): Payer: Self-pay | Admitting: *Deleted

## 2024-09-20 ENCOUNTER — Ambulatory Visit (HOSPITAL_COMMUNITY)
Admission: EM | Admit: 2024-09-20 | Discharge: 2024-09-20 | Disposition: A | Discharge status: Home / Self Care | Payer: MEDICAID | Source: Home / Self Care

## 2024-09-20 DIAGNOSIS — J209 Acute bronchitis, unspecified: Secondary | ICD-10-CM

## 2024-09-20 MED ORDER — PROMETHAZINE-DM 6.25-15 MG/5ML PO SYRP: 5.0000 mL | ORAL_SOLUTION | Freq: Four times a day (QID) | ORAL | 0 refills | Status: AC | PRN

## 2024-09-20 MED ORDER — PREDNISONE 20 MG PO TABS: 40.0000 mg | ORAL_TABLET | Freq: Every day | ORAL | 0 refills | Status: AC

## 2024-09-20 NOTE — ED Triage Notes (Signed)
 PT thinks she has RSV . 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 Pt also reports she has SHOB.

## 2024-09-20 NOTE — ED Provider Notes (Addendum)
 [facility]    CSN: [id number] Arrival date & time: 09/20/24  1134      History   Chief Complaint Chief Complaint  Patient presents with   Cough   Sore Throat    HPI Tonya Martin is a 28 y.o. female.   Patient presents to clinic over concern of cough and chest tightness.  She has been sick for the past week with a cough but yesterday her coughing got much worse.  Feels like it happens more frequently and she was unable to get to bed at night.  Over the past week the cough has been dry but yesterday she was coughing up some mucus.  Denies wheezing, does have some shortness of breath with coughing fits.  Has not had fever, body aches or chills.  Denies nasal congestion or rhinorrhea. 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 Has not had medications or interventions for her symptoms.  The history is provided by the patient and medical records.  Cough Sore Throat    Past Medical History:  Diagnosis Date   Allergy    Anxiety    Depression    Medical history non-contributory    PTSD (post-traumatic stress disorder)    Vision abnormalities     Patient Active Problem List   Diagnosis Date Noted   Malpositioned intrauterine device 07/19/2024   Cervical cancer screening 07/19/2024   Screening examination for STI 07/19/2024   Normal labor 08/02/2019   Post term pregnancy over 40 weeks 08/02/2019   IUD (intrauterine device) in place 08/02/2019   Supervision of normal pregnancy, antepartum 01/19/2019   Other seasonal allergic rhinitis 12/27/2015   Adjustment disorder with mixed anxiety and depressed mood 04/13/2013   Food allergy 04/13/2013    Past Surgical History:  Procedure Laterality Date   NO PAST SURGERIES      OB History     Gravida  1   Para  1   Term  1   Preterm      AB      Living  1      SAB      IAB      Ectopic      Multiple  0   Live Births  1            Home Medications    Prior to  Admission medications  Medication Sig Start Date End Date Taking? Authorizing Provider  predniSONE  (DELTASONE ) 20 MG tablet Take 2 tablets (40 mg total) by mouth daily for 5 days. 09/20/24 09/25/24 Yes Brady Plant  N, FNP  promethazine -dextromethorphan (PROMETHAZINE -DM) 6.25-15 MG/5ML syrup Take 5 mLs by mouth 4 (four) times daily as needed for cough. 09/20/24  Yes Tashena Ibach  N, FNP  VRAYLAR 1.5 MG capsule Take 1 tablet by mouth. 02/11/24  Yes [provider]  fluconazole  (DIFLUCAN ) 150 MG tablet Take 1 tablet (150 mg total) by mouth daily. 12/10/20   Tonette Lauraine HERO, PA-C  ibuprofen  (ADVIL ) 600 MG tablet Take 1 tablet (600 mg total) by mouth every 6 (six) hours as needed. 02/18/20   Odell Balls, PA-C  mupirocin  ointment (BACTROBAN ) 2 % Apply 1 Application topically 2 (two) times daily. 06/22/24   Wallace Joesph LABOR, PA  Prenatal Vit-Fe Fumarate-FA (PRENATAL VITAMIN) 27-0.8 MG TABS Take 1 tablet by mouth daily. 12/15/18   Viviana Aleck DASEN, FNP    Family History Family History  Problem Relation Age of Onset   Cancer Mother  Diabetes Mother    Hyperlipidemia Mother    Hypertension Mother     Social History Social History[1]   Allergies   Peanut-containing drug products and Flonase  [fluticasone  propionate]   Review of Systems Review of Systems  Per HPI  Physical Exam Triage Vital Signs ED Triage Vitals [09/20/24 1229]  Encounter Vitals Group     BP 119/66     Girls Systolic BP Percentile      Girls Diastolic BP Percentile      Boys Systolic BP Percentile      Boys Diastolic BP Percentile      Pulse Rate 96     Resp 18     Temp 98.3 F (36.8 C)     Temp src      SpO2 95 %     Weight      Height      Head Circumference      Peak Flow      Pain Score      Pain Loc      Pain Education      Exclude from Growth Chart    No data found.  Updated Vital Signs BP 119/66   Pulse 96   Temp 98.3 F (36.8 C)   Resp 18   LMP 09/02/2024   SpO2 95%    Visual Acuity Right Eye Distance:   Left Eye Distance:   Bilateral Distance:    Right Eye Near:   Left Eye Near:    Bilateral Near:     Physical Exam Vitals and nursing note reviewed.  Constitutional:      Appearance: Normal appearance.  HENT:     Head: Normocephalic and atraumatic.     Right Ear: External ear normal.     Left Ear: External ear normal.     Nose: Nose normal.     Mouth/Throat:     Mouth: Mucous membranes are moist.     Pharynx: Posterior oropharyngeal erythema present.  Eyes:     Conjunctiva/sclera: Conjunctivae normal.  Cardiovascular:     Rate and Rhythm: Normal rate and regular rhythm.     Heart sounds: Normal heart sounds. No murmur heard. Pulmonary:     Effort: Pulmonary effort is normal. No respiratory distress.     Breath sounds: Normal breath sounds. No wheezing.  Skin:    General: Skin is warm and dry.  Neurological:     General: No focal deficit present.     Mental Status: She is alert and oriented to person, place, and time.  Psychiatric:        Mood and Affect: Mood normal.        Behavior: Behavior normal.      UC Treatments / Results  Labs (all labs ordered are listed, but only abnormal results are displayed) Labs Reviewed - No data to display  EKG   Radiology No results found.  Procedures Procedures (including critical care time)  Medications Ordered in UC Medications - No data to display  Initial Impression / Assessment and Plan / UC Course  I have reviewed the triage vital signs and the nursing notes.  Pertinent labs & imaging results that were available during my care of the patient were reviewed by me and considered in my medical decision making (see chart for details).  Vitals and triage reviewed, patient is hemodynamically stable.  Lungs vesicular, heart with regular rate and rhythm.  Posterior pharynx with erythema.  Oxygenation stable, able to speak in full sentences in no  acute distress.  Without fever,  fatigue or clinical presentation consistent with pneumonia.  Chest x-ray deferred.  Suspect bronchitis, will treat with steroids and cough syrup.  Plan of care, follow-up care return precautions given, no questions at this time.  Work note provided.    Final Clinical Impressions(s) / UC Diagnoses   Final diagnoses:  Acute bronchitis, unspecified organism     Discharge Instructions      Start the steroids today and then daily with breakfast Use the cough syrup up to 4 times daily as needed for cough, this may cause drowsiness Ensure you are drinking at least 64 ounces of water daily to stay hydrated and loosen any potential secretions  Symptoms should improve with medication, if no improvement or any changes seek follow-up care     ED Prescriptions     Medication Sig Dispense Auth. Provider   predniSONE  (DELTASONE ) 20 MG tablet Take 2 tablets (40 mg total) by mouth daily for 5 days. 10 tablet Dreama, Emaline Karnes  N, FNP   promethazine -dextromethorphan (PROMETHAZINE -DM) 6.25-15 MG/5ML syrup Take 5 mLs by mouth 4 (four) times daily as needed for cough. 118 mL Dreama, Dayten Juba  N, FNP      PDMP not reviewed this encounter.        [1]  Social History Tobacco Use   Smoking status: Never   Smokeless tobacco: Never   Tobacco comments:    marijuana   Vaping Use   Vaping status: Every Day  Substance Use Topics   Alcohol use: Yes    Comment: Social   Drug use: Yes    Types: Marijuana     Dreama, Erisha Paugh  N, FNP 09/20/24 1300

## 2024-09-20 NOTE — Discharge Instructions (Signed)
 Start the steroids today and then daily with breakfast Use the cough syrup up to 4 times daily as needed for cough, this may cause drowsiness Ensure you are drinking at least 64 ounces of water daily to stay hydrated and loosen any potential secretions  Symptoms should improve with medication, if no improvement or any changes seek follow-up care
